# Patient Record
Sex: Male | Born: 1955
Health system: Southern US, Community
[De-identification: ages and names within clinical notes are randomized; demographics above are authoritative.]

## PROBLEM LIST (undated history)

## (undated) DIAGNOSIS — K219 Gastro-esophageal reflux disease without esophagitis: Secondary | ICD-10-CM

## (undated) DIAGNOSIS — N200 Calculus of kidney: Secondary | ICD-10-CM

## (undated) HISTORY — DX: Calculus of kidney: N20.0

## (undated) HISTORY — PX: BLADDER SURGERY: SHX569

## (undated) HISTORY — PX: TONSILLECTOMY: SUR1361

---

## 2011-07-29 ENCOUNTER — Ambulatory Visit: Payer: Self-pay | Admitting: Family

## 2018-09-16 ENCOUNTER — Ambulatory Visit (INDEPENDENT_AMBULATORY_CARE_PROVIDER_SITE_OTHER): Payer: 59 | Admitting: Nurse Practitioner

## 2018-09-16 ENCOUNTER — Other Ambulatory Visit: Payer: Self-pay

## 2018-09-16 ENCOUNTER — Encounter: Payer: Self-pay | Admitting: Nurse Practitioner

## 2018-09-16 DIAGNOSIS — G629 Polyneuropathy, unspecified: Secondary | ICD-10-CM | POA: Diagnosis not present

## 2018-09-16 DIAGNOSIS — Z87442 Personal history of urinary calculi: Secondary | ICD-10-CM | POA: Diagnosis not present

## 2018-09-16 NOTE — Patient Instructions (Signed)

## 2018-09-16 NOTE — Progress Notes (Addendum)
New Patient Office Visit  Subjective:  Patient ID: [name], male    DOB: 1955/05/12  Age: 63 y.o. MRN: [id number]  CC:  Chief Complaint  Patient presents with  . Establish Care    . This visit was completed via Doximity due to the restrictions of the COVID-19 pandemic. All issues as above were discussed and addressed. Physical exam was done as above through visual confirmation on Doximity video. If it was felt that the patient should be evaluated in the office, they were directed there. The patient verbally consented to this visit. . Location of the patient: work . Location of the provider: home . Those involved with this call:  . Provider: Marnee Guarneri, DNP . CMA: Yvonna Alanis, CMA . Front Desk/Registration: Jill Side  . Time spent on call: 25 minutes with patient face to face via video conference. More than 50% of this time was spent in counseling and coordination of care. 10 minutes total spent in review of patient's record and preparation of their chart. I verified patient identity using two factors (patient name and date of birth). Patient consents verbally to being seen via telemedicine visit today.   HPI Jordan Gaines presents for new patient visit to establish care.  Introduced to Designer, jewellery role and practice setting.  All questions answered.  Has need seen a provider in 4-5 years.  He denies any recent health issues.          Has history of kidney stones and frequent UTIs.  States urine lately has been a "little cloudy" at times.  About 2 weeks ago he saw some "dark red flakes" in urine.  His urine fluctuates between normal and darker.  Does endorses occasional episodes of discomfort in lower back, none today.         Reports episodes of hand numbness and feet numbness at times.  Has a father with diabetes, who will be 52 in January.  He only had to take oral medications.  Does endorse drinking 2 12 oz sodas a day and occasionally eats sweets, but not  "as much as I used to".  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  In his current position he "wears a lot of hats", maintenance and lifting often.  Also works part-time at [employer], [employer] in [place].         We discussed all maintenance he is due for: colonoscopy, Tetanus, HIV, Hep C screening, and prostate screening.    Past Medical History:  Diagnosis Date  . Kidney stones     Past Surgical History:  Procedure Laterality Date  . BLADDER SURGERY    . TONSILLECTOMY      Family History  Problem Relation Age of Onset  . Diabetes Mother   . Heart disease Mother   . Diabetes Father   . Multiple sclerosis Son   . Heart disease Maternal Grandfather     Social History   Socioeconomic History  . Marital status: Divorced    Spouse name: Not on file  . Number of children: Not on file  . Years of education: Not on file  . Highest education level: Not on file  Occupational History  . Occupation: Armed forces logistics/support/administrative officer    Comment: [employer]  Social Needs  . Financial resource strain: Not hard at all  . Food insecurity:    Worry: Never true    Inability: Never true  . Transportation needs:    Medical: No  Non-medical: No  Tobacco Use  . Smoking status: Never Smoker  . Smokeless tobacco: Never Used  Substance and Sexual Activity  . Alcohol use: Never    Frequency: Never  . Drug use: Never  . Sexual activity: Yes  Lifestyle  . Physical activity:    Days per week: 3 days    Minutes per session: 20 min  . Stress: Not at all  Relationships  . Social connections:    Talks on phone: More than three times a week    Gets together: More than three times a week    Attends religious service: 1 to 4 times per year    Active member of club or organization: No    Attends meetings of clubs or organizations: Never    Relationship status: Divorced  . Intimate partner violence:    Fear of current or ex partner: No    Emotionally abused: No    Physically abused: No     Forced sexual activity: No  Other Topics Concern  . Not on file  Social History Narrative  . Not on file    ROS Review of Systems  Constitutional: Negative for activity change, diaphoresis, fatigue and fever.  Respiratory: Negative for cough, chest tightness, shortness of breath and wheezing.   Cardiovascular: Negative for chest pain, palpitations and leg swelling.  Gastrointestinal: Negative for abdominal distention, abdominal pain, constipation, diarrhea, nausea and vomiting.  Endocrine: Negative for cold intolerance, heat intolerance, polydipsia, polyphagia and polyuria.  Genitourinary: Negative for decreased urine volume, discharge, dysuria, flank pain, hematuria, testicular pain and urgency.  Musculoskeletal: Negative.   Skin: Negative.   Neurological: Positive for numbness (occasional in hands and feet). Negative for dizziness, syncope, weakness, light-headedness and headaches.  Psychiatric/Behavioral: Negative.     Objective:   Today's Vitals: There were no vitals taken for this visit.  Physical Exam Vitals signs and nursing note reviewed.  Constitutional:      General: He is awake. He is not in acute distress.    Appearance: He is well-developed. He is not ill-appearing.  HENT:     Head: Normocephalic.     Right Ear: Hearing normal. No drainage.     Left Ear: Hearing normal. No drainage.  Eyes:     General: Lids are normal.        Right eye: No discharge.        Left eye: No discharge.     Conjunctiva/sclera: Conjunctivae normal.  Neck:     Musculoskeletal: Normal range of motion.  Cardiovascular:     Comments: Unable to auscultate due to virtual exam only Pulmonary:     Effort: Pulmonary effort is normal. No accessory muscle usage or respiratory distress.     Comments: Unable to auscultate due to virtual exam only Musculoskeletal: Normal range of motion.  Neurological:     Mental Status: He is alert and oriented to person, place, and time.  Psychiatric:         Mood and Affect: Mood normal.        Behavior: Behavior normal. Behavior is cooperative.        Thought Content: Thought content normal.        Judgment: Judgment normal.     Assessment & Plan:   Problem List Items Addressed This Visit      Nervous and Auditory   Neuropathy - Primary    Check A1C and physical labs on 09/19/2018 at physical.        Other  History of kidney stones    Check urine on physical exam Monday.         No outpatient encounter medications on file as of 09/16/2018.   No facility-administered encounter medications on file as of 09/16/2018.     Follow-up: Return for coming for physical 09/19/2018.   Venita Lick, NP   I discussed the assessment and treatment plan with the patient. The patient was provided an opportunity to ask questions and all were answered. The patient agreed with the plan and demonstrated an understanding of the instructions.   The patient was advised to call back or seek an in-person evaluation if the symptoms worsen or if the condition fails to improve as anticipated.   I provided 21+ minutes of time during this encounter.

## 2018-09-16 NOTE — Assessment & Plan Note (Signed)
Check urine on physical exam Monday.

## 2018-09-16 NOTE — Assessment & Plan Note (Signed)
Check A1C and physical labs on 09/19/2018 at physical.

## 2018-09-19 ENCOUNTER — Ambulatory Visit (INDEPENDENT_AMBULATORY_CARE_PROVIDER_SITE_OTHER): Payer: 59 | Admitting: Nurse Practitioner

## 2018-09-19 ENCOUNTER — Other Ambulatory Visit: Payer: Self-pay

## 2018-09-19 ENCOUNTER — Encounter: Payer: Self-pay | Admitting: Nurse Practitioner

## 2018-09-19 VITALS — BP 135/77 | HR 81 | Temp 98.2°F | Ht 74.0 in | Wt 295.0 lb

## 2018-09-19 DIAGNOSIS — E669 Obesity, unspecified: Secondary | ICD-10-CM | POA: Insufficient documentation

## 2018-09-19 DIAGNOSIS — G629 Polyneuropathy, unspecified: Secondary | ICD-10-CM

## 2018-09-19 DIAGNOSIS — Z0000 Encounter for general adult medical examination without abnormal findings: Secondary | ICD-10-CM | POA: Diagnosis not present

## 2018-09-19 DIAGNOSIS — R300 Dysuria: Secondary | ICD-10-CM | POA: Diagnosis not present

## 2018-09-19 DIAGNOSIS — Z23 Encounter for immunization: Secondary | ICD-10-CM

## 2018-09-19 DIAGNOSIS — K219 Gastro-esophageal reflux disease without esophagitis: Secondary | ICD-10-CM | POA: Diagnosis not present

## 2018-09-19 DIAGNOSIS — Z125 Encounter for screening for malignant neoplasm of prostate: Secondary | ICD-10-CM

## 2018-09-19 DIAGNOSIS — Z114 Encounter for screening for human immunodeficiency virus [HIV]: Secondary | ICD-10-CM

## 2018-09-19 DIAGNOSIS — E6609 Other obesity due to excess calories: Secondary | ICD-10-CM

## 2018-09-19 DIAGNOSIS — Z1159 Encounter for screening for other viral diseases: Secondary | ICD-10-CM

## 2018-09-19 DIAGNOSIS — Z6837 Body mass index (BMI) 37.0-37.9, adult: Secondary | ICD-10-CM

## 2018-09-19 DIAGNOSIS — Z13220 Encounter for screening for lipoid disorders: Secondary | ICD-10-CM

## 2018-09-19 DIAGNOSIS — Z1329 Encounter for screening for other suspected endocrine disorder: Secondary | ICD-10-CM

## 2018-09-19 MED ORDER — OMEPRAZOLE 20 MG PO CPDR: 20.0000 mg | DELAYED_RELEASE_CAPSULE | Freq: Every day | ORAL | 5 refills | Status: DC

## 2018-09-19 NOTE — Progress Notes (Signed)
BP 135/77    Pulse 81    Temp 98.2 F (36.8 C) (Oral)    Ht 6\' 2"  (1.88 m)    Wt 295 lb (133.8 kg)    SpO2 94%    BMI 37.88 kg/m    Subjective:    Patient ID: Jordan Gaines, male    DOB: 09/12/1955, 63 y.o.   MRN: [id number]  HPI: Jordan Gaines is a 63 y.o. male presenting on 09/19/2018 for comprehensive medical examination. Current medical complaints include: urinary symptoms intermittent + GERD  He currently lives with: self Interim Problems from his last visit: no   URINARY SYMPTOMS Has history of kidney stones and frequent UTIs.  Reports urine lately has been a "little cloudy" at times. About 2 weeks ago he saw some "dark red flakes" in urine.  His urine fluctuates between normal and darker at baseline.  Does endorse occasional episodes of discomfort in lower back, none reported today.   Dysuria: every now and then Urinary frequency: yes Urgency: yes Small volume voids: yes Symptom severity: no Urinary incontinence: no Foul odor: yes Hematuria: yes Abdominal pain: no Back pain: yes Suprapubic pain/pressure: no Flank pain: no Fever:  no Vomiting: no Relief with cranberry juice: no Relief with pyridium: no Status: stable Previous urinary tract infection: no Recurrent urinary tract infection: yes Sexual activity: monogamous History of sexually transmitted disease: no Penile discharge: no Treatments attempted: increasing fluids   GERD Currently takes Omeprazole twice a week, which helps when he gets reflux.  Has acid reflux a couple times a month.  Has had issues with reflux off/on for two years.  The last couple months has had a feeling like something is stuck in throat, intermittent, a couple times a week.  Denies difficulty swallowing with episodes, but reports frequent clearing of throat when feeling present.   GERD control status: stable  Satisfied with current treatment? yes Heartburn frequency: two times a week Medication side effects: no  Medication compliance:  stable Previous GERD medications: Antacid use frequency:  twice a week Duration: 30 minutes Nature: burning Location: epigastric Heartburn duration: 2 years Alleviatiating factors:  Tums and Prilosec Aggravating factors: foods Dysphagia: no Odynophagia:  no Hematemesis: no Blood in stool: no EGD: no   NEUROPATHY PAIN: Discussed at previous visit his ongoing, intermittent issues over past year with neuropathy pain to hands and feet.  Hands worse than feet.  Mild discomfort.  Nothing makes better or worst that he can think of.  Does not take medication at home for this.    Functional Status Survey: Is the patient deaf or have difficulty hearing?: No Does the patient have difficulty seeing, even when wearing glasses/contacts?: No Does the patient have difficulty concentrating, remembering, or making decisions?: No Does the patient have difficulty walking or climbing stairs?: No Does the patient have difficulty dressing or bathing?: No Does the patient have difficulty doing errands alone such as visiting a doctor's office or shopping?: No  FALL RISK: Fall Risk  09/16/2018  Falls in the past year? 0  Number falls in past yr: 0  Injury with Fall? 0  Follow up Falls evaluation completed    Depression Screen Depression screen [facility] 2/9 09/19/2018 09/16/2018  Decreased Interest 0 0  Down, Depressed, Hopeless 0 0  PHQ - 2 Score 0 0  Altered sleeping 1 -  Tired, decreased energy 0 -  Change in appetite 0 -  Feeling bad or failure about yourself  0 -  Trouble concentrating  0 -  Moving slowly or fidgety/restless 0 -  Suicidal thoughts 0 -  PHQ-9 Score 1 -  Difficult doing work/chores Not difficult at all -    Past Medical History:  Past Medical History:  Diagnosis Date   Kidney stones     Surgical History:  Past Surgical History:  Procedure Laterality Date   BLADDER SURGERY     TONSILLECTOMY      Medications:  Current Outpatient Medications on File Prior to Visit    Medication Sig   Omeprazole (PRILOSEC PO) Take by mouth as needed.   No current facility-administered medications on file prior to visit.     Allergies:  No Known Allergies  Social History:  Social History   Socioeconomic History   Marital status: Divorced    Spouse name: Not on file   Number of children: Not on file   Years of education: Not on file   Highest education level: Not on file  Occupational History   Occupation: Armed forces logistics/support/administrative officer    Comment: [employer] resource strain: Not hard at all   Food insecurity:    Worry: Never true    Inability: Never true   Transportation needs:    Medical: No    Non-medical: No  Tobacco Use   Smoking status: Never Smoker   Smokeless tobacco: Never Used  Substance and Sexual Activity   Alcohol use: Never    Frequency: Never   Drug use: Never   Sexual activity: Yes  Lifestyle   Physical activity:    Days per week: 3 days    Minutes per session: 20 min   Stress: Not at all  Relationships   Social connections:    Talks on phone: More than three times a week    Gets together: More than three times a week    Attends religious service: 1 to 4 times per year    Active member of club or organization: No    Attends meetings of clubs or organizations: Never    Relationship status: Divorced   Intimate partner violence:    Fear of current or ex partner: No    Emotionally abused: No    Physically abused: No    Forced sexual activity: No  Other Topics Concern   Not on file  Social History Narrative   Not on file   Social History   Tobacco Use  Smoking Status Never Smoker  Smokeless Tobacco Never Used   Social History   Substance and Sexual Activity  Alcohol Use Never   Frequency: Never    Family History:  Family History  Problem Relation Age of Onset   Diabetes Mother    Heart disease Mother    Diabetes Father    Multiple sclerosis Son    Heart disease Maternal  Grandfather     Past medical history, surgical history, medications, allergies, family history and social history reviewed with patient today and changes made to appropriate areas of the chart.   Review of Systems - heart burn and urinary symptoms All other ROS negative except what is listed above and in the HPI.      Objective:    BP 135/77    Pulse 81    Temp 98.2 F (36.8 C) (Oral)    Ht 6\' 2"  (1.88 m)    Wt 295 lb (133.8 kg)    SpO2 94%    BMI 37.88 kg/m   Wt Readings from Last 3 Encounters:  09/19/18 295 lb (133.8 kg)    Physical Exam Vitals signs and nursing note reviewed.  Constitutional:      General: He is not in acute distress.    Appearance: He is well-developed. He is obese. He is not ill-appearing.  HENT:     Head: Normocephalic and atraumatic.     Right Ear: Hearing, tympanic membrane, ear canal and external ear normal. No drainage.     Left Ear: Hearing, tympanic membrane, ear canal and external ear normal. No drainage.     Nose: Nose normal.     Mouth/Throat:     Mouth: Mucous membranes are moist.     Pharynx: Uvula midline.  Eyes:     General: Lids are normal.        Right eye: No discharge.        Left eye: No discharge.     Extraocular Movements: Extraocular movements intact.     Conjunctiva/sclera: Conjunctivae normal.     Pupils: Pupils are equal, round, and reactive to light.     Visual Fields: Right eye visual fields normal and left eye visual fields normal.  Neck:     Musculoskeletal: Normal range of motion and neck supple.     Thyroid: No thyromegaly.     Vascular: No carotid bruit.     Trachea: Trachea normal.  Cardiovascular:     Rate and Rhythm: Normal rate and regular rhythm.     Heart sounds: Normal heart sounds, S1 normal and S2 normal. No murmur. No gallop.   Pulmonary:     Effort: Pulmonary effort is normal. No accessory muscle usage or respiratory distress.     Breath sounds: Normal breath sounds.  Abdominal:     General: Bowel  sounds are normal.     Palpations: Abdomen is soft. There is no hepatomegaly or splenomegaly.     Tenderness: There is no abdominal tenderness. There is no right CVA tenderness or left CVA tenderness.     Hernia: No hernia is present.  Musculoskeletal: Normal range of motion.     Right lower leg: No edema.     Left lower leg: No edema.     Comments: Normal ROM and strength BLE and BUE.  Positive Tinel and Phalen on exam BUE.  Skin:    General: Skin is warm and dry.     Capillary Refill: Capillary refill takes less than 2 seconds.     Findings: No rash.  Neurological:     Mental Status: He is alert and oriented to person, place, and time.     Deep Tendon Reflexes: Reflexes are normal and symmetric.  Psychiatric:        Attention and Perception: Attention normal.        Mood and Affect: Mood normal.        Speech: Speech normal.        Behavior: Behavior normal. Behavior is cooperative.        Thought Content: Thought content normal.        Cognition and Memory: Cognition normal.        Judgment: Judgment normal.      No results found for this or any previous visit.    Assessment & Plan:   Problem List Items Addressed This Visit      Digestive   Gastroesophageal reflux disease without esophagitis    Ongoing, recommend daily Prilosec at this time.  Script sent. Has not had colonoscopy, will refer to GI and may possibly benefit from  upper GI series at same time due to ongoing GERD issues with recent feeling of bolus in throat intermittently.  Referral to GI placed.  Follow-up one month.  CMP & CBC today      Relevant Medications   Omeprazole (PRILOSEC PO)   omeprazole (PRILOSEC) 20 MG capsule   Other Relevant Orders   Ambulatory referral to Gastroenterology     Nervous and Auditory   Neuropathy    Check A1C, B12, and TSH on labs today.  Dependent on labs treat accordingly.  Consider neurology referral for possible Carpal Tunnel if ongoing issues and labs return WNL.          Relevant Orders   Vitamin B12     Other   Dysuria    Will obtain urine sample today, if positive findings will treat accordingly.  If ongoing issues with hematuria and kidney stones consider urology referral.  Obtain PSA lab today for screening.        Relevant Orders   UA/M w/rflx Culture, Routine   Obesity    Recommend continued focus on health diet choices and regular physical activity (30 minutes 5 days a week).       Other Visit Diagnoses    Encounter for annual physical exam    -  Primary   Relevant Orders   CBC with Differential/Platelet   Comprehensive metabolic panel   Lipid Panel w/o Chol/HDL Ratio   TSH   HgB A1c   PSA   Encounter for screening for HIV       HIV screen ordered   Relevant Orders   HIV Antibody (routine testing w rflx)   Need for hepatitis C screening test       Hep C screen ordered   Relevant Orders   Hepatitis C antibody   Need for diphtheria-tetanus-pertussis (Tdap) vaccine       Td ordered   Relevant Orders   Tdap vaccine greater than or equal to 7yo IM (Completed)   Prostate cancer screening       PSA ordered   Relevant Orders   PSA   Thyroid disorder screen       TSH ordered   Relevant Orders   TSH   Screening cholesterol level       Lipid panel   Relevant Orders   Lipid Panel w/o Chol/HDL Ratio       Discussed aspirin prophylaxis for myocardial infarction prevention and decision was that we recommended ASA, and patient refused  LABORATORY TESTING:  Health maintenance labs ordered today as discussed above.   The natural history of prostate cancer and ongoing controversy regarding screening and potential treatment outcomes of prostate cancer has been discussed with the patient. The meaning of a false positive PSA and a false negative PSA has been discussed. He indicates understanding of the limitations of this screening test and wishes to proceed with screening PSA testing.   IMMUNIZATIONS:   - Tdap: Tetanus vaccination  status reviewed: Td vaccination indicated and given today. - Influenza: Up to date - Pneumovax: Not applicable - Prevnar: Not applicable - Zostavax vaccine: Refused  SCREENING: - Colonoscopy: Ordered today  Discussed with patient purpose of the colonoscopy is to detect colon cancer at curable precancerous or early stages   - AAA Screening: Not applicable  -Hearing Test: Not applicable  -Spirometry: Not applicable   PATIENT COUNSELING:    Sexuality: Discussed sexually transmitted diseases, partner selection, use of condoms, avoidance of unintended pregnancy  and contraceptive alternatives.  Advised to avoid cigarette smoking.  I discussed with the patient that most people either abstain from alcohol or drink within safe limits (<=14/week and <=4 drinks/occasion for males, <=7/weeks and <= 3 drinks/occasion for females) and that the risk for alcohol disorders and other health effects rises proportionally with the number of drinks per week and how often a drinker exceeds daily limits.  Discussed cessation/primary prevention of drug use and availability of treatment for abuse.   Diet: Encouraged to adjust caloric intake to maintain  or achieve ideal body weight, to reduce intake of dietary saturated fat and total fat, to limit sodium intake by avoiding high sodium foods and not adding table salt, and to maintain adequate dietary potassium and calcium preferably from fresh fruits, vegetables, and low-fat dairy products.    stressed the importance of regular exercise  Injury prevention: Discussed safety belts, safety helmets, smoke detector, smoking near bedding or upholstery.   Dental health: Discussed importance of regular tooth brushing, flossing, and dental visits.   Follow up plan: NEXT PREVENTATIVE PHYSICAL DUE IN 1 YEAR. Return in about 4 weeks (around 10/17/2018) for Follow-up.

## 2018-09-19 NOTE — Assessment & Plan Note (Signed)
Will obtain urine sample today, if positive findings will treat accordingly.  If ongoing issues with hematuria and kidney stones consider urology referral.  Obtain PSA lab today for screening.

## 2018-09-19 NOTE — Assessment & Plan Note (Signed)
Ongoing, recommend daily Prilosec at this time.  Script sent. Has not had colonoscopy, will refer to GI and may possibly benefit from upper GI series at same time due to ongoing GERD issues with recent feeling of bolus in throat intermittently.  Referral to GI placed.  Follow-up one month.  CMP & CBC today

## 2018-09-19 NOTE — Patient Instructions (Signed)

## 2018-09-19 NOTE — Assessment & Plan Note (Signed)
Check A1C, B12, and TSH on labs today.  Dependent on labs treat accordingly.  Consider neurology referral for possible Carpal Tunnel if ongoing issues and labs return WNL.

## 2018-09-19 NOTE — Assessment & Plan Note (Signed)
Recommend continued focus on health diet choices and regular physical activity (30 minutes 5 days a week). 

## 2018-09-20 ENCOUNTER — Telehealth: Payer: Self-pay | Admitting: Nurse Practitioner

## 2018-09-20 ENCOUNTER — Other Ambulatory Visit: Payer: Self-pay | Admitting: Nurse Practitioner

## 2018-09-20 LAB — COMPREHENSIVE METABOLIC PANEL
ALT: 13 IU/L (ref 0–44)
AST: 15 IU/L (ref 0–40)
Albumin/Globulin Ratio: 1.5 (ref 1.2–2.2)
Albumin: 4.1 g/dL (ref 3.8–4.8)
Alkaline Phosphatase: 50 IU/L (ref 39–117)
BUN/Creatinine Ratio: 22 (ref 10–24)
BUN: 21 mg/dL (ref 8–27)
Bilirubin Total: 0.5 mg/dL (ref 0.0–1.2)
CO2: 23 mmol/L (ref 20–29)
Calcium: 9.3 mg/dL (ref 8.6–10.2)
Chloride: 107 mmol/L — ABNORMAL HIGH (ref 96–106)
Creatinine, Ser: 0.97 mg/dL (ref 0.76–1.27)
GFR calc Af Amer: 95 mL/min/{1.73_m2} (ref >59)
GFR calc non Af Amer: 82 mL/min/{1.73_m2} (ref >59)
Globulin, Total: 2.7 g/dL (ref 1.5–4.5)
Glucose: 92 mg/dL (ref 65–99)
Potassium: 4 mmol/L (ref 3.5–5.2)
Sodium: 143 mmol/L (ref 134–144)
Total Protein: 6.8 g/dL (ref 6.0–8.5)

## 2018-09-20 LAB — LIPID PANEL W/O CHOL/HDL RATIO
Cholesterol, Total: 169 mg/dL (ref 100–199)
HDL: 36 mg/dL — ABNORMAL LOW (ref >39)
LDL Calculated: 113 mg/dL — ABNORMAL HIGH (ref 0–99)
Triglycerides: 100 mg/dL (ref 0–149)
VLDL Cholesterol Cal: 20 mg/dL (ref 5–40)

## 2018-09-20 LAB — CBC WITH DIFFERENTIAL/PLATELET
Basophils Absolute: 0.1 10*3/uL (ref 0.0–0.2)
Basos: 1 %
EOS (ABSOLUTE): 0.1 10*3/uL (ref 0.0–0.4)
Eos: 1 %
Hematocrit: 43.2 % (ref 37.5–51.0)
Hemoglobin: 14.3 g/dL (ref 13.0–17.7)
Immature Grans (Abs): 0 10*3/uL (ref 0.0–0.1)
Immature Granulocytes: 0 %
Lymphocytes Absolute: 1.6 10*3/uL (ref 0.7–3.1)
Lymphs: 22 %
MCH: 29.9 pg (ref 26.6–33.0)
MCHC: 33.1 g/dL (ref 31.5–35.7)
MCV: 90 fL (ref 79–97)
Monocytes Absolute: 0.6 10*3/uL (ref 0.1–0.9)
Monocytes: 8 %
Neutrophils Absolute: 5 10*3/uL (ref 1.4–7.0)
Neutrophils: 68 %
Platelets: 216 10*3/uL (ref 150–450)
RBC: 4.79 x10E6/uL (ref 4.14–5.80)
RDW: 12.2 % (ref 11.6–15.4)
WBC: 7.4 10*3/uL (ref 3.4–10.8)

## 2018-09-20 LAB — HIV ANTIBODY (ROUTINE TESTING W REFLEX): HIV Screen 4th Generation wRfx: NONREACTIVE

## 2018-09-20 LAB — HEMOGLOBIN A1C
Est. average glucose Bld gHb Est-mCnc: 105 mg/dL
Hgb A1c MFr Bld: 5.3 % (ref 4.8–5.6)

## 2018-09-20 LAB — VITAMIN B12: Vitamin B-12: 400 pg/mL (ref 232–1245)

## 2018-09-20 LAB — HEPATITIS C ANTIBODY: Hep C Virus Ab: <0.1 s/co ratio (ref 0.0–0.9)

## 2018-09-20 LAB — TSH: TSH: 1.63 u[IU]/mL (ref 0.450–4.500)

## 2018-09-20 LAB — PSA: Prostate Specific Ag, Serum: 0.7 ng/mL (ref 0.0–4.0)

## 2018-09-20 MED ORDER — ATORVASTATIN CALCIUM 10 MG PO TABS: 10.0000 mg | ORAL_TABLET | Freq: Every day | ORAL | 5 refills | Status: DC

## 2018-09-20 MED ORDER — CIPROFLOXACIN HCL 500 MG PO TABS: 500.0000 mg | ORAL_TABLET | Freq: Two times a day (BID) | ORAL | 0 refills | Status: AC

## 2018-09-20 NOTE — Telephone Encounter (Signed)
Spoke to patient via telephone and reviewed

## 2018-09-20 NOTE — Telephone Encounter (Signed)
Discussed lab results with patient via telephone, sending in abx for UTI and medication for cholesterol level with ASCVD of 13.9%.  He agrees and reports understanding of POC.  To return once abx done for urine sample.

## 2018-09-20 NOTE — Telephone Encounter (Signed)
Copied from [place] [id number]. Topic: General - Other >> Sep 20, 2018 10:52 AM Rutherford Nail, NT wrote: Reason for CRM: Patient returning a call to [facility] regarding lab results. Attempted office x2, no answer. Please advise. States that if a voicemail with results could be left, that would be great since he is not allowed to have his phone inside work. >> Sep 20, 2018 12:16 PM Leward Quan A wrote: Patient called back for lab results and stated that it was the third time he called for results from yesterday. Asking for a call back today will be on lunce until 1.15 pm

## 2018-09-21 LAB — UA/M W/RFLX CULTURE, ROUTINE
Bilirubin, UA: NEGATIVE
Glucose, UA: NEGATIVE
Ketones, UA: NEGATIVE
Nitrite, UA: POSITIVE — AB
Specific Gravity, UA: >1.03 — ABNORMAL HIGH (ref 1.005–1.030)
Urobilinogen, Ur: 0.2 mg/dL (ref 0.2–1.0)
pH, UA: 6 (ref 5.0–7.5)

## 2018-09-21 LAB — MICROSCOPIC EXAMINATION
RBC, Urine: >30 /hpf — AB (ref 0–2)
WBC, UA: >30 /hpf — AB (ref 0–5)

## 2018-09-21 LAB — URINE CULTURE, REFLEX

## 2018-09-28 ENCOUNTER — Telehealth: Payer: Self-pay | Admitting: Nurse Practitioner

## 2018-09-28 DIAGNOSIS — R300 Dysuria: Secondary | ICD-10-CM

## 2018-09-28 NOTE — Telephone Encounter (Signed)
Yes, will place order for urine for Friday.

## 2018-09-28 NOTE — Telephone Encounter (Signed)
Copied from [place] [id number]. Topic: Appointment Scheduling - Scheduling Inquiry for Clinic >> Sep 26, 2018  4:07 PM Margot Ables wrote: Reason for CRM: pt states he is wanting to come in Thursday for a repeat urinalysis. He said Lucus Lambertson advised him to come by. Please advise. >> Sep 28, 2018  9:26 AM Don Perking M wrote: I did not see an order, is this ok or would the pt need an appt?

## 2018-09-29 ENCOUNTER — Other Ambulatory Visit: Payer: 59

## 2018-09-29 ENCOUNTER — Other Ambulatory Visit: Payer: Self-pay

## 2018-09-29 ENCOUNTER — Other Ambulatory Visit: Payer: Self-pay | Admitting: Nurse Practitioner

## 2018-09-29 DIAGNOSIS — R3121 Asymptomatic microscopic hematuria: Secondary | ICD-10-CM

## 2018-09-29 DIAGNOSIS — R300 Dysuria: Secondary | ICD-10-CM

## 2018-09-29 LAB — UA/M W/RFLX CULTURE, ROUTINE
Bilirubin, UA: NEGATIVE
Glucose, UA: NEGATIVE
Ketones, UA: NEGATIVE
Leukocytes,UA: NEGATIVE
Nitrite, UA: NEGATIVE
Specific Gravity, UA: 1.02 (ref 1.005–1.030)
Urobilinogen, Ur: 0.2 mg/dL (ref 0.2–1.0)
pH, UA: 7 (ref 5.0–7.5)

## 2018-09-29 LAB — MICROSCOPIC EXAMINATION
Bacteria, UA: NONE SEEN
RBC, Urine: >30 /hpf — AB (ref 0–2)

## 2018-09-29 NOTE — Progress Notes (Signed)
Urology referral for ongoing hematuria and h/o similar + kidney stones.

## 2018-09-29 NOTE — Telephone Encounter (Addendum)
Called and left pt message that orders were in to call to schedule lab appointment.

## 2018-10-17 ENCOUNTER — Other Ambulatory Visit: Payer: Self-pay

## 2018-10-17 ENCOUNTER — Encounter: Payer: Self-pay | Admitting: Nurse Practitioner

## 2018-10-17 ENCOUNTER — Ambulatory Visit (INDEPENDENT_AMBULATORY_CARE_PROVIDER_SITE_OTHER): Payer: 59 | Admitting: Nurse Practitioner

## 2018-10-17 VITALS — BP 136/80 | HR 70 | Temp 98.4°F

## 2018-10-17 DIAGNOSIS — B351 Tinea unguium: Secondary | ICD-10-CM | POA: Diagnosis not present

## 2018-10-17 DIAGNOSIS — Z23 Encounter for immunization: Secondary | ICD-10-CM | POA: Diagnosis not present

## 2018-10-17 DIAGNOSIS — R3129 Other microscopic hematuria: Secondary | ICD-10-CM | POA: Insufficient documentation

## 2018-10-17 DIAGNOSIS — R3121 Asymptomatic microscopic hematuria: Secondary | ICD-10-CM

## 2018-10-17 DIAGNOSIS — E7800 Pure hypercholesterolemia, unspecified: Secondary | ICD-10-CM

## 2018-10-17 DIAGNOSIS — B354 Tinea corporis: Secondary | ICD-10-CM | POA: Diagnosis not present

## 2018-10-17 DIAGNOSIS — Z87448 Personal history of other diseases of urinary system: Secondary | ICD-10-CM | POA: Insufficient documentation

## 2018-10-17 MED ORDER — NYSTATIN 100000 UNIT/GM EX CREA: 1.0000 "application " | TOPICAL_CREAM | Freq: Two times a day (BID) | CUTANEOUS | 1 refills | Status: DC

## 2018-10-17 NOTE — Assessment & Plan Note (Signed)
Bilateral feet. Recommend OTC medications to start, Lamisil or Lotrimin.  Educated on fungal infections of toenails and high potential for recurrence even if improvement with OTC meds.

## 2018-10-17 NOTE — Assessment & Plan Note (Signed)
Continues to pass 3+ blood and 1+ protein on urine & today leuk 1+ + many bacteria.  Will wait on culture prior to determining treatment need as patient currently asymptomatic.  He is scheduled to see urology in August for ongoing hematuria.  Continue to monitor.

## 2018-10-17 NOTE — Progress Notes (Signed)
BP 136/80 (BP Location: Left Arm, Patient Position: Sitting)   Pulse 70   Temp 98.4 F (36.9 C) (Oral)   SpO2 95%    Subjective:    Patient ID: Jordan Gaines, male    DOB: 12/13/1955, 63 y.o.   MRN: [id number]  HPI: Jordan Gaines is a 63 y.o. male  Chief Complaint  Patient presents with  . Follow-up   HYPERLIPIDEMIA Started on Atorvastatin at 10 MG on 09/20/18.  09/19/18 LDL 113 and TCHOL 169. Hyperlipidemia status: good compliance Satisfied with current treatment?  yes Side effects:  no Medication compliance: good compliance Past cholesterol meds: atorvastain (lipitor) Supplements: none Aspirin:  no The 10-year ASCVD risk score Mikey Bussing DC Jr., et al., 2013) is: 15.1%   Values used to calculate the score:     Age: 79 years     Sex: Male     Is Non-Hispanic African American: No     Diabetic: No     Tobacco smoker: No     Systolic Blood Pressure: 660 mmHg     Is BP treated: No     HDL Cholesterol: 36 mg/dL     Total Cholesterol: 169 mg/dL Chest pain:  no Coronary artery disease:  no Family history CAD:  no Family history early CAD:  no   HEMATURIA: Treated for UTI in the beginning of June with Cipro.  Has history of kidney stones and reported he frequently has episodes of blood in urine.  On recheck of urine after UTI treated continued to have blood present, 3+ and >30 micro, although he reported not noting it on urination and improvement in UTI symptoms.  Due to his past history with kidney stones in 2007 and his self report of frequent episodes blood in urine, he is scheduled to see urology in August.  Denies dysuria, hematuria, frequency, urgency.  RASH Has been present for a few months, fluctuates between being prominent and then not as prominent. Duration:  months  Location: trunk  Itching: yes Burning: no Redness: no Oozing: no Scaling: no Blisters: no Painful: no Fevers: no Change in detergents/soaps/personal care products: no Recent illness: no Recent  travel:no History of same: no Context: stable Alleviating factors: antibiotic cream Treatments attempted:antibiotic cream Shortness of breath: no  Throat/tongue swelling: no Myalgias/arthralgias: no   ONYCHOMYCOSIS: Reports he has had fungal issues with toes for several years, every other toenail.  States tip of toes recently started getting a little discolored, had shoes that were too small and now has purchased shoes that fit better and this has improved.  Has not tried any OTC medications for toes.  Reports toenails are thick and difficult to cut, his girlfriend cuts them for him.  States he does wear boots a lot at work and has moisture present to feet.  Relevant past medical, surgical, family and social history reviewed and updated as indicated. Interim medical history since our last visit reviewed. Allergies and medications reviewed and updated.  Review of Systems  Constitutional: Negative for activity change, diaphoresis, fatigue and fever.  Respiratory: Negative for cough, chest tightness, shortness of breath and wheezing.   Cardiovascular: Negative for chest pain, palpitations and leg swelling.  Gastrointestinal: Negative for abdominal distention, abdominal pain, constipation, diarrhea, nausea and vomiting.  Endocrine: Negative for cold intolerance, heat intolerance, polydipsia, polyphagia and polyuria.  Genitourinary: Negative for decreased urine volume, dysuria, flank pain, frequency, hematuria, penile pain and urgency.  Musculoskeletal: Negative.   Skin: Positive for rash.  Neurological: Negative for  dizziness, syncope, weakness, light-headedness, numbness and headaches.  Psychiatric/Behavioral: Negative.     Per HPI unless specifically indicated above     Objective:    BP 136/80 (BP Location: Left Arm, Patient Position: Sitting)   Pulse 70   Temp 98.4 F (36.9 C) (Oral)   SpO2 95%   Wt Readings from Last 3 Encounters:  09/19/18 295 lb (133.8 kg)    Physical Exam  Vitals signs and nursing note reviewed.  Constitutional:      General: He is awake. He is not in acute distress.    Appearance: He is well-developed. He is not ill-appearing.  HENT:     Head: Normocephalic and atraumatic.     Right Ear: Hearing normal. No drainage.     Left Ear: Hearing normal. No drainage.     Mouth/Throat:     Pharynx: Uvula midline.  Eyes:     General: Lids are normal.        Right eye: No discharge.        Left eye: No discharge.     Conjunctiva/sclera: Conjunctivae normal.     Pupils: Pupils are equal, round, and reactive to light.  Neck:     Musculoskeletal: Normal range of motion and neck supple.     Thyroid: No thyromegaly.     Vascular: No carotid bruit or JVD.     Trachea: Trachea normal.  Cardiovascular:     Rate and Rhythm: Normal rate and regular rhythm.     Pulses:          Dorsalis pedis pulses are 2+ on the right side and 2+ on the left side.       Posterior tibial pulses are 2+ on the right side and 2+ on the left side.     Heart sounds: Normal heart sounds, S1 normal and S2 normal. No murmur. No gallop.   Pulmonary:     Effort: Pulmonary effort is normal.     Breath sounds: Normal breath sounds.  Abdominal:     General: Bowel sounds are normal.     Palpations: Abdomen is soft. There is no hepatomegaly or splenomegaly.  Musculoskeletal: Normal range of motion.     Right lower leg: No edema.     Left lower leg: No edema.  Feet:     Right foot:     Skin integrity: Skin integrity normal.     Toenail Condition: Right toenails are abnormally thick. Fungal disease present.    Left foot:     Skin integrity: Skin integrity normal.     Toenail Condition: Left toenails are abnormally thick. Fungal disease present. Skin:    General: Skin is warm and dry.     Capillary Refill: Capillary refill takes less than 2 seconds.     Findings: No rash.       Neurological:     Mental Status: He is alert and oriented to person, place, and time.     Deep  Tendon Reflexes: Reflexes are normal and symmetric.  Psychiatric:        Mood and Affect: Mood normal.        Behavior: Behavior normal. Behavior is cooperative.        Thought Content: Thought content normal.        Judgment: Judgment normal.     Results for orders placed or performed in visit on 09/29/18  Microscopic Examination   URINE  Result Value Ref Range   WBC, UA 0-5 0 - 5 /hpf  RBC >30 (A) 0 - 2 /hpf   Epithelial Cells (non renal) 0-10 0 - 10 /hpf   Bacteria, UA None seen None seen/Few  UA/M w/rflx Culture, Routine   Specimen: Urine   URINE  Result Value Ref Range   Specific Gravity, UA 1.020 1.005 - 1.030   pH, UA 7.0 5.0 - 7.5   Color, UA Yellow Yellow   Appearance Ur Clear Clear   Leukocytes,UA Negative Negative   Protein,UA 2+ (A) Negative/Trace   Glucose, UA Negative Negative   Ketones, UA Negative Negative   RBC, UA 3+ (A) Negative   Bilirubin, UA Negative Negative   Urobilinogen, Ur 0.2 0.2 - 1.0 mg/dL   Nitrite, UA Negative Negative   Microscopic Examination See below:       Assessment & Plan:   Problem List Items Addressed This Visit      Musculoskeletal and Integument   Tinea corporis    Script for nystatin cream sent.  Educated on proper hygiene techniques and drying thoroughly after showering.      Relevant Medications   nystatin cream (MYCOSTATIN)   Onychomycosis    Bilateral feet. Recommend OTC medications to start, Lamisil or Lotrimin.  Educated on fungal infections of toenails and high potential for recurrence even if improvement with OTC meds.      Relevant Medications   nystatin cream (MYCOSTATIN)     Genitourinary   Asymptomatic microscopic hematuria - Primary    Continues to pass 3+ blood and 1+ protein on urine & today leuk 1+ + many bacteria.  Will wait on culture prior to determining treatment need as patient currently asymptomatic.  He is scheduled to see urology in August for ongoing hematuria.  Continue to monitor.       Relevant Orders   UA/M w/rflx Culture, Routine     Other   Pure hypercholesterolemia    Started on Lipitor last visit.  Lipid panel today and adjust dose as needed.  Continue focus on diet at home.      Relevant Orders   Lipid Panel w/o Chol/HDL Ratio    Other Visit Diagnoses    Need for pneumococcal vaccination       Relevant Orders   Pneumococcal conjugate vaccine 13-valent IM (Completed)       Follow up plan: Return in about 3 months (around 01/17/2019) for HLD and hematuria.

## 2018-10-17 NOTE — Assessment & Plan Note (Signed)
Started on Lipitor last visit.  Lipid panel today and adjust dose as needed.  Continue focus on diet at home.

## 2018-10-17 NOTE — Assessment & Plan Note (Signed)
Script for nystatin cream sent.  Educated on proper hygiene techniques and drying thoroughly after showering.

## 2018-10-17 NOTE — Patient Instructions (Signed)
Pneumococcal Conjugate Vaccine (PCV13): What You Need to Know 1. Why get vaccinated? Pneumococcal conjugate vaccine (PCV13) can prevent pneumococcal disease. Pneumococcal disease refers to any illness caused by pneumococcal bacteria. These bacteria can cause many types of illnesses, including pneumonia, which is an infection of the lungs. Pneumococcal bacteria are one of the most common causes of pneumonia. Besides pneumonia, pneumococcal bacteria can also cause:  Ear infections  Sinus infections  Meningitis (infection of the tissue covering the brain and spinal cord)  Bacteremia (bloodstream infection) Anyone can get pneumococcal disease, but children under 2 years of age, people with certain medical conditions, adults 65 years or older, and cigarette smokers are at the highest risk. Most pneumococcal infections are mild. However, some can result in long-term problems, such as brain damage or hearing loss. Meningitis, bacteremia, and pneumonia caused by pneumococcal disease can be fatal. 2. PCV13 PCV13 protects against 13 types of bacteria that cause pneumococcal disease. Infants and young children usually need 4 doses of pneumococcal conjugate vaccine, at 2, 4, 6, and 12-15 months of age. In some cases, a child might need fewer than 4 doses to complete PCV13 vaccination. A dose of PCV23 vaccine is also recommended for anyone 2 years or older with certain medical conditions if they did not already receive PCV13. This vaccine may be given to adults 65 years or older based on discussions between the patient and health care provider. 3. Talk with your health care provider Tell your vaccine provider if the person getting the vaccine:  Has had an allergic reaction after a previous dose of PCV13, to an earlier pneumococcal conjugate vaccine known as PCV7, or to any vaccine containing diphtheria toxoid (for example, DTaP), or has any severe, life-threatening allergies.  In some cases, your health  care provider may decide to postpone PCV13 vaccination to a future visit. People with minor illnesses, such as a cold, may be vaccinated. People who are moderately or severely ill should usually wait until they recover before getting PCV13. Your health care provider can give you more information. 4. Risks of a vaccine reaction  Redness, swelling, pain, or tenderness where the shot is given, and fever, loss of appetite, fussiness (irritability), feeling tired, headache, and chills can happen after PCV13. Young children may be at increased risk for seizures caused by fever after PCV13 if it is administered at the same time as inactivated influenza vaccine. Ask your health care provider for more information. People sometimes faint after medical procedures, including vaccination. Tell your provider if you feel dizzy or have vision changes or ringing in the ears. As with any medicine, there is a very remote chance of a vaccine causing a severe allergic reaction, other serious injury, or death. 5. What if there is a serious problem? An allergic reaction could occur after the vaccinated person leaves the clinic. If you see signs of a severe allergic reaction (hives, swelling of the face and throat, difficulty breathing, a fast heartbeat, dizziness, or weakness), call 9-1-1 and get the person to the nearest hospital. For other signs that concern you, call your health care provider. Adverse reactions should be reported to the Vaccine Adverse Event Reporting System (VAERS). Your health care provider will usually file this report, or you can do it yourself. Visit the VAERS website at www.vaers.hhs.gov or call 1-800-822-7967. VAERS is only for reporting reactions, and VAERS staff do not give medical advice. 6. The National Vaccine Injury Compensation Program The National Vaccine Injury Compensation Program (VICP) is a federal program   that was created to compensate people who may have been injured by certain  vaccines. Visit the VICP website at GoldCloset.com.ee or call (458) 518-9520 to learn about the program and about filing a claim. There is a time limit to file a claim for compensation. 7. How can I learn more?  Ask your health care provider.  Call your local or state health department.  Contact the Centers for Disease Control and Prevention (CDC): ? Call 9074175420 (1-800-CDC-INFO) or ? Visit CDC's website at http://hunter.com/ Vaccine Information Statement PCV13 Vaccine (02/09/2018) This information is not intended to replace advice given to you by your health care provider. Make sure you discuss any questions you have with your health care provider. Document Released: 01/25/2006 Document Revised: 07/19/2018 Document Reviewed: 11/09/2017 Elsevier Patient Education  2020 [employer].

## 2018-10-18 ENCOUNTER — Encounter: Payer: Self-pay | Admitting: Gastroenterology

## 2018-10-18 ENCOUNTER — Ambulatory Visit (INDEPENDENT_AMBULATORY_CARE_PROVIDER_SITE_OTHER): Payer: 59 | Admitting: Gastroenterology

## 2018-10-18 ENCOUNTER — Other Ambulatory Visit: Payer: Self-pay

## 2018-10-18 VITALS — BP 132/83 | HR 62 | Temp 97.9°F | Ht 74.0 in | Wt 308.8 lb

## 2018-10-18 DIAGNOSIS — K219 Gastro-esophageal reflux disease without esophagitis: Secondary | ICD-10-CM | POA: Diagnosis not present

## 2018-10-18 DIAGNOSIS — Z1211 Encounter for screening for malignant neoplasm of colon: Secondary | ICD-10-CM

## 2018-10-18 LAB — LIPID PANEL W/O CHOL/HDL RATIO
Cholesterol, Total: 111 mg/dL (ref 100–199)
HDL: 28 mg/dL — ABNORMAL LOW (ref >39)
LDL Calculated: 64 mg/dL (ref 0–99)
Triglycerides: 94 mg/dL (ref 0–149)
VLDL Cholesterol Cal: 19 mg/dL (ref 5–40)

## 2018-10-18 MED ORDER — FAMOTIDINE 20 MG PO TABS: 20.0000 mg | ORAL_TABLET | Freq: Two times a day (BID) | ORAL | 1 refills | Status: DC

## 2018-10-18 NOTE — Progress Notes (Signed)
Jordan Bellows MD, MRCP(U.K) [address]  [place], [place] [address]  Main: [phone]  Fax: [phone]   Gastroenterology Consultation  Referring Provider:     Venita Lick, NP Primary Care Physician:  Jordan Lick, NP Primary Gastroenterologist:  Dr. Jonathon Gaines  Reason for Consultation:     GERD        HPI:   Jordan Gaines is a 63 y.o. y/o male referred for consultation & management  by Dr. Venita Lick, NP.      Reflux:  Onset : last few years on and off- never severe - likely 3-4 years.  Symptoms: heart burn - occurs when he misses his medications- once veery few weeks  Recent weight gain: no  Narcotics or anticholinergics use : no  PPI /H2 blockers or Antacid  use and timing :omeprazole 20 mgfirst thing in the morning  Dinner time : 6-7 pm - goes to bed around 10 pm - sitting up in between   Prior EGD: no  Family history of esophageal cancer:no  He is not a smoker, does not chew tobacco.    Never had a screening colonoscopy , no GI symptoms. No family history of colon cancer or polyps/   Past Medical History:  Diagnosis Date  . Kidney stones     Past Surgical History:  Procedure Laterality Date  . BLADDER SURGERY    . TONSILLECTOMY      Prior to Admission medications   Medication Sig Start Date End Date Taking? Authorizing Provider  nystatin cream (MYCOSTATIN) Apply 1 application topically 2 (two) times daily. 10/17/18  Yes Gaines, Jordan T, NP  omeprazole (PRILOSEC) 20 MG capsule Take 1 capsule (20 mg total) by mouth daily. 09/19/18  Yes Gaines, Jordan T, NP  atorvastatin (LIPITOR) 10 MG tablet Take 1 tablet (10 mg total) by mouth daily at 6 PM. Patient not taking: Reported on 10/18/2018 09/20/18   Jordan Lick, NP    Family History  Problem Relation Age of Onset  . Diabetes Mother   . Heart disease Mother   . Diabetes Father   . Multiple sclerosis Son   . Heart disease Maternal Grandfather      Social History    Tobacco Use  . Smoking status: Never Smoker  . Smokeless tobacco: Never Used  Substance Use Topics  . Alcohol use: Never    Frequency: Never  . Drug use: Never    Allergies as of 10/18/2018  . (No Known Allergies)    Review of Systems:    All systems reviewed and negative except where noted in HPI.   Physical Exam:  BP 132/83   Pulse 62   Temp 97.9 F (36.6 C)   Ht 6\' 2"  (1.88 m)   Wt (!) 308 lb 12.8 oz (140.1 kg)   BMI 39.65 kg/m  No LMP for male patient. Psych:  Alert and cooperative. Normal mood and affect. General:   Alert,  Well-developed, well-nourished, pleasant and cooperative in NAD Head:  Normocephalic and atraumatic. Eyes:  Sclera clear, no icterus.   Conjunctiva pink. Ears:  Normal auditory acuity. Nose:  No deformity, discharge, or lesions. Mouth:  No deformity or lesions,oropharynx pink & moist. Neck:  Supple; no masses or thyromegaly. Lungs:  Respirations even and unlabored.  Clear throughout to auscultation.   No wheezes, crackles, or rhonchi. No acute distress. Heart:  Regular rate and rhythm; no murmurs, clicks, rubs, or gallops. Abdomen:  Normal bowel sounds.  No bruits.  Soft, non-tender and non-distended without masses, hepatosplenomegaly or hernias noted.  No guarding or rebound tenderness.    Neurologic:  Alert and oriented x3;  grossly normal neurologically. Skin:  Intact without significant lesions or rashes. No jaundice. Lymph Nodes:  No significant cervical adenopathy. Psych:  Alert and cooperative. Normal mood and affect.  Imaging Studies: No results found.  Assessment and Plan:   Jordan Gaines is a 63 y.o. y/o male has been referred for GERD, Also due to screening colonoscopy    1. GERD : Counseled on life style changes, suggest to use PPI first thing in the morning on empty stomach and eat 30 minutes after. Advised on the use of a wedge pillow at night , avoid meals for 2 hours prior to bed time. Weight loss .Discussed the risks and  benefits of long term PPI use including but not limited to bone loss, chronic kidney disease, infections , low magnesium . Aim to use at the lowest dose for the shortest period of time  Patient information provided.   Stop Prilosec and start on Pepcid 20 mg BID(fewer side effects)- if works then will decrease to once daily at night at next visit.   2. Screening colonoscopy    I have discussed alternative options, risks & benefits,  which include, but are not limited to, bleeding, infection, perforation,respiratory complication & drug reaction.  The patient agrees with this plan & written consent will be obtained.    Follow up in 5 months   Dr Jordan Bellows MD,MRCP(U.K)

## 2018-10-18 NOTE — Patient Instructions (Signed)

## 2018-10-19 ENCOUNTER — Telehealth: Payer: Self-pay | Admitting: Gastroenterology

## 2018-10-19 NOTE — Telephone Encounter (Signed)
Returned pt call regarding his questions about the reflux medication.  Unable to contact, LVM to return call

## 2018-10-19 NOTE — Telephone Encounter (Signed)
The patient had a recent Kindey infection & is scheduled to see the Urologist.

## 2018-10-19 NOTE — Telephone Encounter (Signed)
Patient called & has seen by Dr Vicente Males  He prescribed famotidine that has side effects for kidneys. He uses [facility]. He also has questions about the medication he was taken for reflux that stated with an O. Please call & l/m if he does not answer.

## 2018-10-20 NOTE — Telephone Encounter (Signed)
In comparison to Prilosec, pepcid is safer. Ok to use with recent kidney infection

## 2018-10-20 NOTE — Telephone Encounter (Signed)
Called pt to inform him of Dr. Georgeann Oppenheim advice.  Unable to contact, left detailed VM

## 2018-10-21 LAB — UA/M W/RFLX CULTURE, ROUTINE
Bilirubin, UA: NEGATIVE
Glucose, UA: NEGATIVE
Ketones, UA: NEGATIVE
Nitrite, UA: NEGATIVE
Specific Gravity, UA: 1.025 (ref 1.005–1.030)
Urobilinogen, Ur: 0.2 mg/dL (ref 0.2–1.0)
pH, UA: 5.5 (ref 5.0–7.5)

## 2018-10-21 LAB — MICROSCOPIC EXAMINATION: WBC, UA: >30 /hpf — AB (ref 0–5)

## 2018-10-21 LAB — URINE CULTURE, REFLEX

## 2018-10-24 ENCOUNTER — Ambulatory Visit: Payer: 59 | Admitting: Nurse Practitioner

## 2018-10-25 ENCOUNTER — Other Ambulatory Visit
Admission: RE | Admit: 2018-10-25 | Discharge: 2018-10-25 | Disposition: A | Discharge status: Home / Self Care | Payer: BC Managed Care – PPO | Source: Ambulatory Visit | Attending: Gastroenterology | Admitting: Gastroenterology

## 2018-10-25 ENCOUNTER — Other Ambulatory Visit: Payer: Self-pay

## 2018-10-25 DIAGNOSIS — Z1159 Encounter for screening for other viral diseases: Secondary | ICD-10-CM | POA: Insufficient documentation

## 2018-10-26 LAB — SARS CORONAVIRUS 2 (TAT 6-24 HRS): SARS Coronavirus 2: NEGATIVE

## 2018-10-28 ENCOUNTER — Ambulatory Visit: Payer: 59 | Admitting: Anesthesiology

## 2018-10-28 ENCOUNTER — Other Ambulatory Visit: Payer: Self-pay

## 2018-10-28 ENCOUNTER — Ambulatory Visit
Admission: RE | Admit: 2018-10-28 | Discharge: 2018-10-28 | Disposition: A | Discharge status: Home / Self Care | Payer: 59 | Attending: Gastroenterology | Admitting: Gastroenterology

## 2018-10-28 ENCOUNTER — Encounter
Admission: RE | Disposition: A | Discharge status: Home / Self Care | Payer: Self-pay | Source: Home / Self Care | Attending: Gastroenterology

## 2018-10-28 ENCOUNTER — Encounter: Payer: Self-pay | Admitting: *Deleted

## 2018-10-28 DIAGNOSIS — K5730 Diverticulosis of large intestine without perforation or abscess without bleeding: Secondary | ICD-10-CM | POA: Insufficient documentation

## 2018-10-28 DIAGNOSIS — K648 Other hemorrhoids: Secondary | ICD-10-CM | POA: Diagnosis not present

## 2018-10-28 DIAGNOSIS — Z1211 Encounter for screening for malignant neoplasm of colon: Secondary | ICD-10-CM

## 2018-10-28 DIAGNOSIS — K5790 Diverticulosis of intestine, part unspecified, without perforation or abscess without bleeding: Secondary | ICD-10-CM | POA: Diagnosis not present

## 2018-10-28 DIAGNOSIS — D120 Benign neoplasm of cecum: Secondary | ICD-10-CM | POA: Insufficient documentation

## 2018-10-28 DIAGNOSIS — K635 Polyp of colon: Secondary | ICD-10-CM

## 2018-10-28 DIAGNOSIS — D122 Benign neoplasm of ascending colon: Secondary | ICD-10-CM | POA: Diagnosis not present

## 2018-10-28 DIAGNOSIS — Z79899 Other long term (current) drug therapy: Secondary | ICD-10-CM | POA: Diagnosis not present

## 2018-10-28 DIAGNOSIS — K219 Gastro-esophageal reflux disease without esophagitis: Secondary | ICD-10-CM | POA: Insufficient documentation

## 2018-10-28 HISTORY — PX: COLONOSCOPY WITH PROPOFOL: SHX5780

## 2018-10-28 HISTORY — DX: Gastro-esophageal reflux disease without esophagitis: K21.9

## 2018-10-28 SURGERY — COLONOSCOPY WITH PROPOFOL
Anesthesia: General

## 2018-10-28 MED ORDER — MIDAZOLAM HCL 2 MG/2ML IJ SOLN
INTRAMUSCULAR | Status: AC
  Filled 2018-10-28: qty 2

## 2018-10-28 MED ORDER — SODIUM CHLORIDE 0.9 % IV SOLN
INTRAVENOUS | Status: DC
  Administered 2018-10-28: 09:00:00 via INTRAVENOUS

## 2018-10-28 MED ORDER — FENTANYL CITRATE (PF) 100 MCG/2ML IJ SOLN
INTRAMUSCULAR | Status: DC | PRN
  Administered 2018-10-28 (×2): 50 ug via INTRAVENOUS

## 2018-10-28 MED ORDER — MIDAZOLAM HCL 5 MG/5ML IJ SOLN
INTRAMUSCULAR | Status: DC | PRN
  Administered 2018-10-28: 2 mg via INTRAVENOUS

## 2018-10-28 MED ORDER — PROPOFOL 10 MG/ML IV BOLUS
INTRAVENOUS | Status: DC | PRN
  Administered 2018-10-28: 30 mg via INTRAVENOUS
  Administered 2018-10-28: 20 mg via INTRAVENOUS

## 2018-10-28 MED ORDER — PROPOFOL 500 MG/50ML IV EMUL
INTRAVENOUS | Status: DC | PRN
  Administered 2018-10-28: 50 ug/kg/min via INTRAVENOUS

## 2018-10-28 MED ORDER — FENTANYL CITRATE (PF) 100 MCG/2ML IJ SOLN
INTRAMUSCULAR | Status: AC
  Filled 2018-10-28: qty 2

## 2018-10-28 MED ORDER — SODIUM CHLORIDE (PF) 0.9 % IJ SOLN
INTRAMUSCULAR | Status: DC | PRN
  Administered 2018-10-28: 10 mL via INTRAVENOUS

## 2018-10-28 MED ORDER — LIDOCAINE HCL (PF) 2 % IJ SOLN
INTRAMUSCULAR | Status: DC | PRN
  Administered 2018-10-28: 100 mg

## 2018-10-28 MED ORDER — LIDOCAINE HCL (PF) 2 % IJ SOLN
INTRAMUSCULAR | Status: AC
  Filled 2018-10-28: qty 10

## 2018-10-28 NOTE — Transfer of Care (Signed)
Immediate Anesthesia Transfer of Care Note  Patient: Jordan Gaines  Procedure(s) Performed: COLONOSCOPY WITH PROPOFOL (N/A )  Patient Location: PACU  Anesthesia Type:General  Level of Consciousness: sedated  Airway & Oxygen Therapy: Patient Spontanous Breathing and Patient connected to nasal cannula oxygen  Post-op Assessment: Report given to RN and Post -op Vital signs reviewed and stable  Post vital signs: Reviewed and stable  Last Vitals:  Vitals Value Taken Time  BP    Temp    Pulse    Resp    SpO2      Last Pain:  Vitals:   10/28/18 0834  TempSrc: Tympanic  PainSc: 0-No pain         Complications: No apparent anesthesia complications

## 2018-10-28 NOTE — Anesthesia Post-op Follow-up Note (Signed)
Anesthesia QCDR form completed.        

## 2018-10-28 NOTE — Op Note (Signed)
[facility] Gastroenterology Patient Name: Jordan Gaines Procedure Date: 10/28/2018 9:22 AM MRN: [id number] Account #: [id number] Date of Birth: [birth year] Admit Type: Outpatient Age: 63 Room: [facility] ENDO ROOM 3 Gender: Male Note Status: Finalized Procedure:            Colonoscopy Indications:          Screening for colorectal malignant neoplasm Providers:            [name] MD, MD Medicines:            Monitored Anesthesia Care Complications:        No immediate complications. Procedure:            Pre-Anesthesia Assessment:                       - Prior to the procedure, a History and Physical was                        performed, and patient medications, allergies and                        sensitivities were reviewed. The patient's tolerance of                        previous anesthesia was reviewed.                       - The risks and benefits of the procedure and the                        sedation options and risks were discussed with the                        patient. All questions were answered and informed                        consent was obtained.                       - ASA Grade Assessment: II - A patient with mild                        systemic disease.                       After obtaining informed consent, the colonoscope was                        passed under direct vision. Throughout the procedure,                        the patient's blood pressure, pulse, and oxygen                        saturations were monitored continuously. The                        Colonoscope was introduced through the anus and                        advanced to the the cecum, identified by the  appendiceal orifice, IC valve and transillumination.                        The colonoscopy was performed with ease. The patient                        tolerated the procedure well. The quality of the bowel                        preparation was  adequate. Findings:      The perianal and digital rectal examinations were normal.      A 6 mm polyp was found in the ascending colon. The polyp was sessile.       The polyp was removed with a cold snare. Resection and retrieval were       complete.      A 12 mm polyp was found in the cecum. The polyp was sessile.       Preparations were made for mucosal resection. 7 mL of saline was       injected with adequate lift of the lesion from the muscularis propria.       Snare mucosal resection with suction (via the working channel) retrieval       was performed. A 14 mm area was resected. Resection and retrieval were       complete. There was no bleeding during and at the end of the procedure.       To prevent bleeding after mucosal resection, one hemostatic clip was       successfully placed. There was no bleeding during, or at the end, of the       procedure.      Multiple small-mouthed diverticula were found in the left colon.      Non-bleeding internal hemorrhoids were found during retroflexion. The       hemorrhoids were medium-sized.      The exam was otherwise without abnormality on direct and retroflexion       views. Impression:           - One 6 mm polyp in the ascending colon, removed with a                        cold snare. Resected and retrieved.                       - One 12 mm polyp in the cecum, removed with mucosal                        resection. Resected and retrieved. Clip was placed.                       - Diverticulosis in the left colon.                       - Non-bleeding internal hemorrhoids.                       - The examination was otherwise normal on direct and                        retroflexion views.                       -  Mucosal resection was performed. Resection and                        retrieval were complete. Recommendation:       - Discharge patient to home (with escort).                       - Resume previous diet.                       -  Continue present medications.                       - Await pathology results.                       - Repeat colonoscopy in 3 years for surveillance. Procedure Code(s):    --- Professional ---                       [phone], Colonoscopy, flexible; with endoscopic mucosal                        resection                       45385, 77, Colonoscopy, flexible; with removal of                        tumor(s), polyp(s), or other lesion(s) by snare                        technique Diagnosis Code(s):    --- Professional ---                       Z12.11, Encounter for screening for malignant neoplasm                        of colon                       K63.5, Polyp of colon                       K64.8, Other hemorrhoids                       K57.30, Diverticulosis of large intestine without                        perforation or abscess without bleeding CPT copyright 2019 American Medical Association. All rights reserved. The codes documented in this report are preliminary and upon coder review may  be revised to meet current compliance requirements. [name], MD [name] MD, MD 10/28/2018 9:59:08 AM This report has been signed electronically. Number of Addenda: 0 Note Initiated On: 10/28/2018 9:22 AM Scope Withdrawal Time: 0 hours 15 minutes 39 seconds  Total Procedure Duration: 0 hours 19 minutes 21 seconds  Estimated Blood Loss: Estimated blood loss: none.      [facility]

## 2018-10-28 NOTE — Anesthesia Preprocedure Evaluation (Signed)
Anesthesia Evaluation  Patient identified by MRN, date of birth, ID band Patient awake    Reviewed: Allergy & Precautions, H&P , NPO status , Patient's Chart, lab work & pertinent test results  Airway Mallampati: III  TM Distance: <3 FB Neck ROM: limited    Dental  (+) Chipped, Poor Dentition, Missing, Upper Dentures   Pulmonary neg pulmonary ROS, neg shortness of breath,           Cardiovascular Exercise Tolerance: Good (-) Past MI negative cardio ROS       Neuro/Psych negative neurological ROS  negative psych ROS   GI/Hepatic Neg liver ROS, GERD  Medicated and Controlled,  Endo/Other  negative endocrine ROS  Renal/GU Renal disease  negative genitourinary   Musculoskeletal   Abdominal   Peds  Hematology negative hematology ROS (+)   Anesthesia Other Findings Past Medical History: No date: GERD (gastroesophageal reflux disease) No date: Kidney stones  Past Surgical History: No date: BLADDER SURGERY No date: TONSILLECTOMY  BMI    Body Mass Index: 37.23 kg/m      Reproductive/Obstetrics negative OB ROS                             Anesthesia Physical Anesthesia Plan  ASA: III  Anesthesia Plan: General   Post-op Pain Management:    Induction: Intravenous  PONV Risk Score and Plan: Propofol infusion and TIVA  Airway Management Planned: Natural Airway and Nasal Cannula  Additional Equipment:   Intra-op Plan:   Post-operative Plan:   Informed Consent: I have reviewed the patients History and Physical, chart, labs and discussed the procedure including the risks, benefits and alternatives for the proposed anesthesia with the patient or authorized representative who has indicated his/her understanding and acceptance.     Dental Advisory Given  Plan Discussed with: Anesthesiologist, CRNA and Surgeon  Anesthesia Plan Comments: (Patient consented for risks of anesthesia  including but not limited to:  - adverse reactions to medications - risk of intubation if required - damage to teeth, lips or other oral mucosa - sore throat or hoarseness - Damage to heart, brain, lungs or loss of life  Patient voiced understanding.)        Anesthesia Quick Evaluation

## 2018-10-28 NOTE — Anesthesia Postprocedure Evaluation (Signed)
Anesthesia Post Note  Patient: Jordan Gaines  Procedure(s) Performed: COLONOSCOPY WITH PROPOFOL (N/A )  Patient location during evaluation: Endoscopy Anesthesia Type: General Level of consciousness: awake and alert Pain management: pain level controlled Vital Signs Assessment: post-procedure vital signs reviewed and stable Respiratory status: spontaneous breathing, nonlabored ventilation, respiratory function stable and patient connected to nasal cannula oxygen Cardiovascular status: blood pressure returned to baseline and stable Postop Assessment: no apparent nausea or vomiting Anesthetic complications: no     Last Vitals:  Vitals:   10/28/18 0959 10/28/18 1009  BP: 112/70 117/80  Pulse: 64 (!) 58  Resp: 16 14  Temp: (!) 36 C   SpO2: 95% 94%    Last Pain:  Vitals:   10/28/18 1009  TempSrc:   PainSc: 0-No pain                 Precious Haws Vicke Plotner

## 2018-10-28 NOTE — H&P (Signed)
[name], MD [address] [place], [place], [place], [address] [address], [place] [employer], [place], [place], [address] Phone: [phone]  Fax: [phone]  Primary Care Physician:  Venita Lick, NP   Pre-Procedure History & Physical: HPI:  Jordan Gaines is a 63 y.o. male is here for an colonoscopy.   Past Medical History:  Diagnosis Date  . GERD (gastroesophageal reflux disease)   . Kidney stones     Past Surgical History:  Procedure Laterality Date  . BLADDER SURGERY    . TONSILLECTOMY      Prior to Admission medications   Medication Sig Start Date End Date Taking? Authorizing Provider  famotidine (PEPCID) 20 MG tablet Take 1 tablet (20 mg total) by mouth 2 (two) times daily. 10/18/18 01/18/19 Yes [name], MD  nystatin cream (MYCOSTATIN) Apply 1 application topically 2 (two) times daily. 10/17/18  Yes Cannady, Jolene T, NP  atorvastatin (LIPITOR) 10 MG tablet Take 1 tablet (10 mg total) by mouth daily at 6 PM. Patient not taking: Reported on 10/18/2018 09/20/18   Marnee Guarneri T, NP  omeprazole (PRILOSEC) 20 MG capsule Take 1 capsule (20 mg total) by mouth daily. Patient not taking: Reported on 10/28/2018 09/19/18   Marnee Guarneri T, NP    Allergies as of 10/18/2018  . (No Known Allergies)    Family History  Problem Relation Age of Onset  . Diabetes Mother   . Heart disease Mother   . Diabetes Father   . Multiple sclerosis Son   . Heart disease Maternal Grandfather     Social History   Socioeconomic History  . Marital status: Significant Other    Spouse name: Not on file  . Number of children: Not on file  . Years of education: Not on file  . Highest education level: Not on file  Occupational History  . Occupation: Armed forces logistics/support/administrative officer    Comment: [employer]  Social Needs  . Financial resource strain: Not hard at all  . Food insecurity    Worry: Never true    Inability: Never true  . Transportation needs    Medical: No    Non-medical: No   Tobacco Use  . Smoking status: Never Smoker  . Smokeless tobacco: Never Used  Substance and Sexual Activity  . Alcohol use: Yes    Frequency: Never    Comment: rarely  . Drug use: Never  . Sexual activity: Yes  Lifestyle  . Physical activity    Days per week: 3 days    Minutes per session: 20 min  . Stress: Not at all  Relationships  . Social connections    Talks on phone: More than three times a week    Gets together: More than three times a week    Attends religious service: 1 to 4 times per year    Active member of club or organization: No    Attends meetings of clubs or organizations: Never    Relationship status: Divorced  . Intimate partner violence    Fear of current or ex partner: No    Emotionally abused: No    Physically abused: No    Forced sexual activity: No  Other Topics Concern  . Not on file  Social History Narrative  . Not on file    Review of Systems: See HPI, otherwise negative ROS  Physical Exam: BP 138/84   Pulse 69   Temp 97.9 F (36.6 C) (Tympanic)   Resp 18  Ht 6\' 2"  (1.88 m)   Wt 131.5 kg   SpO2 96%   BMI 37.23 kg/m  General:   Alert,  pleasant and cooperative in NAD Head:  Normocephalic and atraumatic. Neck:  Supple; no masses or thyromegaly. Lungs:  Clear throughout to auscultation, normal respiratory effort.    Heart:  +S1, +S2, Regular rate and rhythm, No edema. Abdomen:  Soft, nontender and nondistended. Normal bowel sounds, without guarding, and without rebound.   Neurologic:  Alert and  oriented x4;  grossly normal neurologically.  Impression/Plan: IRVIN BASTIN is here for an colonoscopy to be performed for Screening colonoscopy average risk   Risks, benefits, limitations, and alternatives regarding  colonoscopy have been reviewed with the patient.  Questions have been answered.  All parties agreeable.   [name], MD  10/28/2018, 9:22 AM

## 2018-10-31 ENCOUNTER — Encounter: Payer: Self-pay | Admitting: Gastroenterology

## 2018-10-31 LAB — SURGICAL PATHOLOGY

## 2018-11-06 ENCOUNTER — Encounter: Payer: Self-pay | Admitting: Gastroenterology

## 2018-11-15 ENCOUNTER — Other Ambulatory Visit: Payer: Self-pay

## 2018-11-15 ENCOUNTER — Encounter: Payer: Self-pay | Admitting: Urology

## 2018-11-15 ENCOUNTER — Ambulatory Visit (INDEPENDENT_AMBULATORY_CARE_PROVIDER_SITE_OTHER): Payer: 59 | Admitting: Urology

## 2018-11-15 VITALS — BP 139/64 | HR 69 | Ht 74.0 in | Wt 301.0 lb

## 2018-11-15 DIAGNOSIS — R3129 Other microscopic hematuria: Secondary | ICD-10-CM | POA: Diagnosis not present

## 2018-11-15 DIAGNOSIS — Z87442 Personal history of urinary calculi: Secondary | ICD-10-CM

## 2018-11-15 DIAGNOSIS — R8281 Pyuria: Secondary | ICD-10-CM

## 2018-11-15 LAB — MICROSCOPIC EXAMINATION
RBC, Urine: NONE SEEN /hpf (ref 0–2)
WBC, UA: >30 /hpf — AB (ref 0–5)

## 2018-11-15 LAB — URINALYSIS, COMPLETE
Bilirubin, UA: NEGATIVE
Glucose, UA: NEGATIVE
Ketones, UA: NEGATIVE
Nitrite, UA: NEGATIVE
Specific Gravity, UA: 1.025 (ref 1.005–1.030)
Urobilinogen, Ur: 0.2 mg/dL (ref 0.2–1.0)
pH, UA: 5.5 (ref 5.0–7.5)

## 2018-11-15 NOTE — Progress Notes (Signed)
11/15/2018 1:54 PM   [name] [birth year] [id number]  Referring provider: Venita Lick, NP [address] [place],  [place] [address]  Chief Complaint  Patient presents with  . Hematuria    New Patient    HPI: Jordan Gaines is a 63 y.o. male seen in consultation at the request of Marnee Guarneri, NP for evaluation of microhematuria and pyuria.  Back in late May/early June he noted small dark flecks of blood in his urine with slight increased odor.  Urinalysis showed >30 WBC and RBCs with many bacteria.  He was treated empirically with Cipro.  Urine culture grew mixed flora.  A PSA was 0.7.  A follow-up urinalysis on 6/18 showed >30 RBCs and a repeat urinalysis in early July showed 11-30 RBCs and >30 WBCs.  Repeat culture grew lactobacillus.  Denies bothersome lower urinary tract symptoms.  He had 2 UTIs several years ago.  He also had an outlet procedure approximately 25 years ago which sounds like either a TURP or treatment of a bladder neck contracture.  He also had a stone in 2007 which he passed.   PMH: Past Medical History:  Diagnosis Date  . GERD (gastroesophageal reflux disease)   . Kidney stones     Surgical History: Past Surgical History:  Procedure Laterality Date  . BLADDER SURGERY    . COLONOSCOPY WITH PROPOFOL N/A 10/28/2018   Procedure: COLONOSCOPY WITH PROPOFOL;  Surgeon: [name], MD;  Location: [facility] ENDOSCOPY;  Service: Gastroenterology;  Laterality: N/A;  . TONSILLECTOMY      Home Medications:  Allergies as of 11/15/2018   No Known Allergies     Medication List       Accurate as of November 15, 2018  1:54 PM. If you have any questions, ask your nurse or doctor.        atorvastatin 10 MG tablet Commonly known as: LIPITOR Take 1 tablet (10 mg total) by mouth daily at 6 PM.   famotidine 20 MG tablet Commonly known as: Pepcid Take 1 tablet (20 mg total) by mouth 2 (two) times daily.   nystatin cream Commonly known as: MYCOSTATIN Apply 1  application topically 2 (two) times daily.   omeprazole 20 MG capsule Commonly known as: PriLOSEC Take 1 capsule (20 mg total) by mouth daily.       Allergies: No Known Allergies  Family History: Family History  Problem Relation Age of Onset  . Diabetes Mother   . Heart disease Mother   . Diabetes Father   . Multiple sclerosis Son   . Heart disease Maternal Grandfather     Social History:  reports that he has never smoked. He has never used smokeless tobacco. He reports current alcohol use. He reports that he does not use drugs.  ROS: UROLOGY Frequent Urination?: No Hard to postpone urination?: No Burning/pain with urination?: No Get up at night to urinate?: No Leakage of urine?: Yes Urine stream starts and stops?: No Trouble starting stream?: No Do you have to strain to urinate?: No Blood in urine?: Yes Urinary tract infection?: No Sexually transmitted disease?: No Injury to kidneys or bladder?: No Painful intercourse?: No Weak stream?: No Erection problems?: No Penile pain?: No  Gastrointestinal Nausea?: No Vomiting?: No Indigestion/heartburn?: No Diarrhea?: No Constipation?: No  Constitutional Fever: No Night sweats?: No Weight loss?: No Fatigue?: No  Skin Skin rash/lesions?: No Itching?: No  Eyes Blurred vision?: No Double vision?: No  Ears/Nose/Throat Sore throat?: No Sinus problems?: No  Hematologic/Lymphatic  Swollen glands?: No Easy bruising?: No  Cardiovascular Leg swelling?: No Chest pain?: No  Respiratory Cough?: No Shortness of breath?: No  Endocrine Excessive thirst?: No  Musculoskeletal Back pain?: No Joint pain?: No  Neurological Headaches?: No Dizziness?: No  Psychologic Depression?: No Anxiety?: No  Physical Exam: BP 139/64   Pulse 69   Ht 6\' 2"  (1.88 m)   Wt (!) 301 lb (136.5 kg)   BMI 38.65 kg/m   Constitutional:  Alert and oriented, No acute distress. HEENT: [place] AT, moist mucus membranes.  Trachea  midline, no masses. Cardiovascular: No clubbing, cyanosis, or edema. Respiratory: Normal respiratory effort, no increased work of breathing. GI: Abdomen is soft, nontender, nondistended, no abdominal masses GU: No CVA tenderness.  Phallus without lesions, meatus normal in appearance.  Testes descended bilaterally without masses or tenderness.  Prostate 35 g, smooth without nodules. Lymph: No cervical or inguinal lymphadenopathy. Skin: No rashes, bruises or suspicious lesions. Neurologic: Grossly intact, no focal deficits, moving all 4 extremities. Psychiatric: Normal mood and affect.  Laboratory Data:  Urinalysis Dipstick trace blood, 1+ leukocyte Microscopy >30 WBC, moderate bacteria  Assessment & Plan:    -Sterile pyuria/microscopic hematuria Will repeat urine culture today although will most likely be negative.  I recommended further evaluation of the upper and lower tracts with CT urogram and cystoscopy.  The procedures were discussed in detail and he elected to proceed.   Abbie Sons, [place] [address] [place], [place] [address] [phone]

## 2018-11-17 LAB — CULTURE, URINE COMPREHENSIVE

## 2018-11-22 ENCOUNTER — Telehealth: Payer: Self-pay

## 2018-11-22 ENCOUNTER — Telehealth: Payer: Self-pay | Admitting: Urology

## 2018-11-22 DIAGNOSIS — N3000 Acute cystitis without hematuria: Secondary | ICD-10-CM

## 2018-11-22 NOTE — Telephone Encounter (Signed)
Can you review this pt's urine cx and advise so that I can call him. Pt has called and is upset no one has reached out to him about results. Thanks

## 2018-11-22 NOTE — Telephone Encounter (Signed)
Pt hasn't heard anything about U/A and culture from 8/4 and was wondering about results.

## 2018-11-22 NOTE — Telephone Encounter (Signed)
Duplicate See previous encounter

## 2018-11-23 NOTE — Telephone Encounter (Signed)
This may be a contaminant but can go ahead and treat with amoxicillin 875 mg twice daily for 7 days

## 2018-11-24 ENCOUNTER — Other Ambulatory Visit: Payer: Self-pay

## 2018-11-24 ENCOUNTER — Ambulatory Visit
Admission: RE | Admit: 2018-11-24 | Discharge: 2018-11-24 | Disposition: A | Discharge status: Home / Self Care | Payer: 59 | Source: Ambulatory Visit | Attending: Urology | Admitting: Urology

## 2018-11-24 DIAGNOSIS — R3129 Other microscopic hematuria: Secondary | ICD-10-CM

## 2018-11-24 DIAGNOSIS — N200 Calculus of kidney: Secondary | ICD-10-CM | POA: Diagnosis not present

## 2018-11-24 LAB — POCT I-STAT CREATININE: Creatinine, Ser: 1.2 mg/dL (ref 0.61–1.24)

## 2018-11-24 MED ORDER — IOHEXOL 300 MG/ML  SOLN
125.0000 mL | Freq: Once | INTRAMUSCULAR | Status: AC | PRN
  Administered 2018-11-24: 125 mL via INTRAVENOUS

## 2018-11-28 ENCOUNTER — Encounter: Payer: Self-pay | Admitting: Urology

## 2018-11-28 MED ORDER — AMOXICILLIN-POT CLAVULANATE 875-125 MG PO TABS: 1.0000 | ORAL_TABLET | Freq: Two times a day (BID) | ORAL | 0 refills | Status: DC

## 2018-11-28 NOTE — Telephone Encounter (Signed)
Called pt informed him of the information below. Pt gave verbal understanding. RX sent in.  °

## 2018-12-01 ENCOUNTER — Encounter: Payer: Self-pay | Admitting: Urology

## 2018-12-01 ENCOUNTER — Ambulatory Visit (INDEPENDENT_AMBULATORY_CARE_PROVIDER_SITE_OTHER): Payer: 59 | Admitting: Urology

## 2018-12-01 ENCOUNTER — Other Ambulatory Visit: Payer: Self-pay

## 2018-12-01 VITALS — BP 141/73 | HR 86 | Ht 74.0 in | Wt 311.4 lb

## 2018-12-01 DIAGNOSIS — N200 Calculus of kidney: Secondary | ICD-10-CM

## 2018-12-01 DIAGNOSIS — R3129 Other microscopic hematuria: Secondary | ICD-10-CM | POA: Diagnosis not present

## 2018-12-01 DIAGNOSIS — R935 Abnormal findings on diagnostic imaging of other abdominal regions, including retroperitoneum: Secondary | ICD-10-CM

## 2018-12-01 LAB — MICROSCOPIC EXAMINATION: Bacteria, UA: NONE SEEN

## 2018-12-01 LAB — URINALYSIS, COMPLETE
Bilirubin, UA: NEGATIVE
Glucose, UA: NEGATIVE
Ketones, UA: NEGATIVE
Leukocytes,UA: NEGATIVE
Nitrite, UA: NEGATIVE
Protein,UA: NEGATIVE
Specific Gravity, UA: 1.025 (ref 1.005–1.030)
Urobilinogen, Ur: 0.2 mg/dL (ref 0.2–1.0)
pH, UA: 6.5 (ref 5.0–7.5)

## 2018-12-01 MED ORDER — LIDOCAINE HCL URETHRAL/MUCOSAL 2 % EX GEL
1.0000 "application " | Freq: Once | CUTANEOUS | Status: AC
  Administered 2018-12-01: 1 via URETHRAL

## 2018-12-03 NOTE — Progress Notes (Signed)
   12/01/2018 4:18 PM   [name] 07/19/1955 [id number]  Referring provider: Venita Lick, NP [address] [place],  [place] [address]  Chief Complaint  Patient presents with  . Hematuria  . Cysto    HPI: 63 y.o. male with history of hematuria and pyuria who on cystoscopy performed today found to have an anterior urethral stricture.  He presents for cystoscopy under anesthesia with urethral stricture dilation.   PMH: Past Medical History:  Diagnosis Date  . GERD (gastroesophageal reflux disease)   . Kidney stones     Surgical History: Past Surgical History:  Procedure Laterality Date  . BLADDER SURGERY    . COLONOSCOPY WITH PROPOFOL N/A 10/28/2018   Procedure: COLONOSCOPY WITH PROPOFOL;  Surgeon: [name], MD;  Location: [facility] ENDOSCOPY;  Service: Gastroenterology;  Laterality: N/A;  . TONSILLECTOMY      Home Medications:  Allergies as of 12/01/2018   No Known Allergies     Medication List       Accurate as of December 01, 2018 11:59 PM. If you have any questions, ask your nurse or doctor.        amoxicillin-clavulanate 875-125 MG tablet Commonly known as: AUGMENTIN Take 1 tablet by mouth 2 (two) times daily.   atorvastatin 10 MG tablet Commonly known as: LIPITOR Take 1 tablet (10 mg total) by mouth daily at 6 PM.   famotidine 20 MG tablet Commonly known as: Pepcid Take 1 tablet (20 mg total) by mouth 2 (two) times daily.   nystatin cream Commonly known as: MYCOSTATIN Apply 1 application topically 2 (two) times daily.   omeprazole 20 MG capsule Commonly known as: PriLOSEC Take 1 capsule (20 mg total) by mouth daily.       Allergies: No Known Allergies  Family History: Family History  Problem Relation Age of Onset  . Diabetes Mother   . Heart disease Mother   . Diabetes Father   . Multiple sclerosis Son   . Heart disease Maternal Grandfather     Social History:  reports that he has never smoked. He has never used smokeless tobacco. He  reports current alcohol use. He reports that he does not use drugs.  ROS: No significant change 11/15/2018  Physical Exam: BP (!) 141/73 (BP Location: Left Arm, Patient Position: Sitting, Cuff Size: Large)   Pulse 86   Ht 6\' 2"  (1.88 m)   Wt (!) 311 lb 6.4 oz (141.3 kg)   BMI 39.98 kg/m   Constitutional:  Alert and oriented, No acute distress. HEENT: [place] AT, moist mucus membranes.  Trachea midline, no masses. Cardiovascular: No clubbing, cyanosis, or edema.  RRR Respiratory: Normal respiratory effort, no increased work of breathing.  Clear GI: Abdomen is soft, nontender, nondistended, no abdominal masses GU: No CVA tenderness Lymph: No cervical or inguinal lymphadenopathy. Skin: No rashes, bruises or suspicious lesions. Neurologic: Grossly intact, no focal deficits, moving all 4 extremities. Psychiatric: Normal mood and affect.   Assessment & Plan:    63 year-old male with an anterior urethral stricture presents for cystoscopy with stricture dilation.  The procedure has been discussed in detail as per my procedure note of this date.  Abbie Sons, [place] [address] [employer], [place] [address] [phone]

## 2018-12-03 NOTE — Progress Notes (Signed)
   12/03/18  CC:  Chief Complaint  Patient presents with  . Hematuria  . Cysto    HPI: [age]YO male initially seen 11/15/2018 for recurrent sterile pyuria, microhematuria with intermittent gross hematuria.  Blood pressure (!) 141/73, pulse 86, height 6\' 2"  (1.88 m), weight (!) 311 lb 6.4 oz (141.3 kg). NED. A&Ox3.   No respiratory distress   Abd soft, NT, ND Normal phallus with bilateral descended testicles  Imaging: CTU was personally reviewed.  3 left lower pole nonobstructing renal calculi.  Large right bladder diverticulum  Incidentally noted to have sclerotic lesions right ilium and right pubic bone felt to be indeterminate.  Bone scan was recommended.  Cystoscopy Procedure Note  Patient identification was confirmed, informed consent was obtained, and patient was prepped using Betadine solution.  Lidocaine jelly was administered per urethral meatus.     Pre-Procedure: - Inspection reveals a normal caliber urethral meatus.  Procedure: The flexible cystoscope was introduced without difficulty - A stricture was identified in the anterior urethra which would not allow passage of the flexible cystoscope.  Estimated at 10 FR.  Remainder of cystoscopy unable to be completed   Post-Procedure: - Patient tolerated the procedure well  Assessment/ Plan:  - Anterior urethral stricture Findings were discussed with Mr. Olshefski and I recommended scheduling cystoscopy with urethral dilation which he requested to have done under anesthesia.  The procedure was discussed in detail with the likelihood of stricture recurrence.  Potential bleeding and infection were also discussed.  He was amenable to additional treatments if abnormalities are found in the bladder including biopsy.  He indicated all questions were answered and desires to schedule.  - Sclerotic bone lesions pelvis Felt to be indeterminate.  Bone scan recommended by radiology.  Abbie Sons, MD

## 2018-12-03 NOTE — H&P (View-Only) (Signed)
   12/01/2018 4:18 PM   Jordan Gaines Apr 14, 1955 [id number]  Referring provider: Venita Lick, NP [address] [place],  [place] [address]  Chief Complaint  Patient presents with  . Hematuria  . Cysto    HPI: 63 y.o. male with history of hematuria and pyuria who on cystoscopy performed today found to have an anterior urethral stricture.  He presents for cystoscopy under anesthesia with urethral stricture dilation.   PMH: Past Medical History:  Diagnosis Date  . GERD (gastroesophageal reflux disease)   . Kidney stones     Surgical History: Past Surgical History:  Procedure Laterality Date  . BLADDER SURGERY    . COLONOSCOPY WITH PROPOFOL N/A 10/28/2018   Procedure: COLONOSCOPY WITH PROPOFOL;  Surgeon: [name], MD;  Location: [facility] ENDOSCOPY;  Service: Gastroenterology;  Laterality: N/A;  . TONSILLECTOMY      Home Medications:  Allergies as of 12/01/2018   No Known Allergies     Medication List       Accurate as of December 01, 2018 11:59 PM. If you have any questions, ask your nurse or doctor.        amoxicillin-clavulanate 875-125 MG tablet Commonly known as: AUGMENTIN Take 1 tablet by mouth 2 (two) times daily.   atorvastatin 10 MG tablet Commonly known as: LIPITOR Take 1 tablet (10 mg total) by mouth daily at 6 PM.   famotidine 20 MG tablet Commonly known as: Pepcid Take 1 tablet (20 mg total) by mouth 2 (two) times daily.   nystatin cream Commonly known as: MYCOSTATIN Apply 1 application topically 2 (two) times daily.   omeprazole 20 MG capsule Commonly known as: PriLOSEC Take 1 capsule (20 mg total) by mouth daily.       Allergies: No Known Allergies  Family History: Family History  Problem Relation Age of Onset  . Diabetes Mother   . Heart disease Mother   . Diabetes Father   . Multiple sclerosis Son   . Heart disease Maternal Grandfather     Social History:  reports that he has never smoked. He has never used smokeless tobacco. He  reports current alcohol use. He reports that he does not use drugs.  ROS: No significant change 11/15/2018  Physical Exam: BP (!) 141/73 (BP Location: Left Arm, Patient Position: Sitting, Cuff Size: Large)   Pulse 86   Ht 6\' 2"  (1.88 m)   Wt (!) 311 lb 6.4 oz (141.3 kg)   BMI 39.98 kg/m   Constitutional:  Alert and oriented, No acute distress. HEENT: [place] AT, moist mucus membranes.  Trachea midline, no masses. Cardiovascular: No clubbing, cyanosis, or edema.  RRR Respiratory: Normal respiratory effort, no increased work of breathing.  Clear GI: Abdomen is soft, nontender, nondistended, no abdominal masses GU: No CVA tenderness Lymph: No cervical or inguinal lymphadenopathy. Skin: No rashes, bruises or suspicious lesions. Neurologic: Grossly intact, no focal deficits, moving all 4 extremities. Psychiatric: Normal mood and affect.   Assessment & Plan:    63 year-old male with an anterior urethral stricture presents for cystoscopy with stricture dilation.  The procedure has been discussed in detail as per my procedure note of this date.  Abbie Sons, [facility] [address] [place], [place] [address] [phone]

## 2018-12-06 ENCOUNTER — Other Ambulatory Visit: Payer: Self-pay

## 2018-12-06 DIAGNOSIS — Z01818 Encounter for other preprocedural examination: Secondary | ICD-10-CM

## 2018-12-07 ENCOUNTER — Other Ambulatory Visit: Payer: 59

## 2018-12-07 ENCOUNTER — Encounter
Admission: RE | Admit: 2018-12-07 | Discharge: 2018-12-07 | Disposition: A | Discharge status: Home / Self Care | Payer: 59 | Source: Ambulatory Visit | Attending: Urology | Admitting: Urology

## 2018-12-07 ENCOUNTER — Other Ambulatory Visit: Payer: Self-pay

## 2018-12-07 DIAGNOSIS — Z01818 Encounter for other preprocedural examination: Secondary | ICD-10-CM | POA: Diagnosis present

## 2018-12-07 DIAGNOSIS — Z20828 Contact with and (suspected) exposure to other viral communicable diseases: Secondary | ICD-10-CM | POA: Insufficient documentation

## 2018-12-07 DIAGNOSIS — Z01810 Encounter for preprocedural cardiovascular examination: Secondary | ICD-10-CM | POA: Diagnosis not present

## 2018-12-07 NOTE — Patient Instructions (Addendum)
  Your procedure is scheduled on: Wednesday 12/14/18.  Report to DAY SURGERY DEPARTMENT LOCATED ON 2ND FLOOR MEDICAL MALL ENTRANCE. To find out your arrival time please call [phone] between 1PM - 3PM on Tuesday 12/14/18.   Remember: Instructions that are not followed completely may result in serious medical risk, up to and including death, or upon the discretion of your surgeon and anesthesiologist your surgery may need to be rescheduled.       _X__ 1. Do not eat food after midnight the night before your procedure.                 No gum chewing or hard candies. You may drink clear liquids up to 2 hours                 before you are scheduled to arrive for your surgery- DO NOT drink clear                 liquids within 2 hours of the start of your surgery.                 Clear Liquids include:  water, apple juice without pulp, clear carbohydrate                 drink such as Clearfast or Gatorade, Black Coffee or Tea (Do not add                 milk or creamer to coffee or tea).    __X__2.  On the morning of surgery brush your teeth with toothpaste and water, you may rinse your mouth with mouthwash if you wish.  Do not swallow any toothpaste or mouthwash.      _X__ 3.  No Alcohol for 24 hours before or after surgery.    __X__6.  Notify your doctor if there is any change in your medical condition      (cold, fever, infections).       Do not wear jewelry, make-up, hairpins, clips or nail polish. Do not wear lotions, powders, or perfumes.  Do not shave 48 hours prior to surgery. Men may shave face and neck. Do not bring valuables to the hospital.     [facility] is not responsible for any belongings or valuables.   Contacts, dentures/partials or body piercings may not be worn into surgery. Bring a case for your contacts, glasses or hearing aids, a denture cup will be supplied.    Patients discharged the day of surgery will not be allowed to drive home.    __X__ Take  these medicines the morning of surgery with A SIP OF WATER:     1. famotidine (PEPCID) 20 MG tablet       __X__ Stop Anti-inflammatories 7 days before surgery such as Advil, Ibuprofen, Motrin, BC or Goodies Powder, Naprosyn, Naproxen, Aleve, Aspirin, Meloxicam. May take Tylenol if needed for pain or discomfort.    __X__ Please do not begin taking any new herbal supplements before your procedure.

## 2018-12-08 LAB — URINE CULTURE: Culture: NO GROWTH

## 2018-12-09 ENCOUNTER — Other Ambulatory Visit: Payer: Self-pay

## 2018-12-09 ENCOUNTER — Other Ambulatory Visit
Admission: RE | Admit: 2018-12-09 | Discharge: 2018-12-09 | Disposition: A | Discharge status: Home / Self Care | Payer: 59 | Source: Ambulatory Visit | Attending: Urology | Admitting: Urology

## 2018-12-09 ENCOUNTER — Other Ambulatory Visit: Payer: 59

## 2018-12-09 DIAGNOSIS — Z01818 Encounter for other preprocedural examination: Secondary | ICD-10-CM | POA: Diagnosis not present

## 2018-12-10 LAB — SARS CORONAVIRUS 2 (TAT 6-24 HRS): SARS Coronavirus 2: NEGATIVE

## 2018-12-13 ENCOUNTER — Other Ambulatory Visit: Payer: 59 | Admitting: Urology

## 2018-12-13 MED ORDER — CEFAZOLIN SODIUM-DEXTROSE 2-4 GM/100ML-% IV SOLN
2.0000 g | Freq: Once | INTRAVENOUS | Status: AC
  Administered 2018-12-14: 2 g via INTRAVENOUS

## 2018-12-14 ENCOUNTER — Ambulatory Visit
Admission: RE | Admit: 2018-12-14 | Discharge: 2018-12-14 | Disposition: A | Discharge status: Home / Self Care | Payer: 59 | Attending: Urology | Admitting: Urology

## 2018-12-14 ENCOUNTER — Ambulatory Visit: Payer: 59 | Admitting: Certified Registered Nurse Anesthetist

## 2018-12-14 ENCOUNTER — Encounter: Payer: Self-pay | Admitting: Certified Registered Nurse Anesthetist

## 2018-12-14 ENCOUNTER — Encounter
Admission: RE | Disposition: A | Discharge status: Home / Self Care | Payer: Self-pay | Source: Home / Self Care | Attending: Urology

## 2018-12-14 DIAGNOSIS — Z6839 Body mass index (BMI) 39.0-39.9, adult: Secondary | ICD-10-CM | POA: Insufficient documentation

## 2018-12-14 DIAGNOSIS — N200 Calculus of kidney: Secondary | ICD-10-CM | POA: Insufficient documentation

## 2018-12-14 DIAGNOSIS — N3080 Other cystitis without hematuria: Secondary | ICD-10-CM | POA: Insufficient documentation

## 2018-12-14 DIAGNOSIS — G629 Polyneuropathy, unspecified: Secondary | ICD-10-CM | POA: Diagnosis not present

## 2018-12-14 DIAGNOSIS — R319 Hematuria, unspecified: Secondary | ICD-10-CM | POA: Diagnosis not present

## 2018-12-14 DIAGNOSIS — Z79899 Other long term (current) drug therapy: Secondary | ICD-10-CM | POA: Diagnosis not present

## 2018-12-14 DIAGNOSIS — E669 Obesity, unspecified: Secondary | ICD-10-CM | POA: Diagnosis not present

## 2018-12-14 DIAGNOSIS — N35919 Unspecified urethral stricture, male, unspecified site: Secondary | ICD-10-CM | POA: Diagnosis not present

## 2018-12-14 DIAGNOSIS — N35914 Unspecified anterior urethral stricture, male: Secondary | ICD-10-CM | POA: Insufficient documentation

## 2018-12-14 DIAGNOSIS — Z792 Long term (current) use of antibiotics: Secondary | ICD-10-CM | POA: Insufficient documentation

## 2018-12-14 DIAGNOSIS — E7800 Pure hypercholesterolemia, unspecified: Secondary | ICD-10-CM | POA: Diagnosis not present

## 2018-12-14 DIAGNOSIS — Z87442 Personal history of urinary calculi: Secondary | ICD-10-CM | POA: Insufficient documentation

## 2018-12-14 DIAGNOSIS — R8281 Pyuria: Secondary | ICD-10-CM

## 2018-12-14 DIAGNOSIS — K219 Gastro-esophageal reflux disease without esophagitis: Secondary | ICD-10-CM | POA: Diagnosis not present

## 2018-12-14 DIAGNOSIS — N35814 Other anterior urethral stricture, male: Secondary | ICD-10-CM | POA: Diagnosis not present

## 2018-12-14 HISTORY — PX: CYSTOSCOPY WITH BIOPSY: SHX5122

## 2018-12-14 HISTORY — PX: CYSTOSCOPY WITH URETHRAL DILATATION: SHX5125

## 2018-12-14 SURGERY — CYSTOSCOPY, WITH URETHRAL DILATION
Anesthesia: General | Site: Urethra

## 2018-12-14 MED ORDER — OXYCODONE HCL 5 MG/5ML PO SOLN: 5.0000 mg | Freq: Once | ORAL | Status: DC | PRN

## 2018-12-14 MED ORDER — DEXAMETHASONE SODIUM PHOSPHATE 10 MG/ML IJ SOLN
INTRAMUSCULAR | Status: AC
  Filled 2018-12-14: qty 1

## 2018-12-14 MED ORDER — FENTANYL CITRATE (PF) 100 MCG/2ML IJ SOLN: 25.0000 ug | INTRAMUSCULAR | Status: DC | PRN

## 2018-12-14 MED ORDER — IOHEXOL 180 MG/ML  SOLN
INTRAMUSCULAR | Status: DC | PRN
  Administered 2018-12-14: 40 mL

## 2018-12-14 MED ORDER — HYDROCODONE-ACETAMINOPHEN 5-325 MG PO TABS: 1.0000 | ORAL_TABLET | ORAL | 0 refills | Status: DC | PRN

## 2018-12-14 MED ORDER — PROPOFOL 10 MG/ML IV BOLUS
INTRAVENOUS | Status: AC
  Filled 2018-12-14: qty 20

## 2018-12-14 MED ORDER — MIDAZOLAM HCL 2 MG/2ML IJ SOLN
INTRAMUSCULAR | Status: AC
  Filled 2018-12-14: qty 2

## 2018-12-14 MED ORDER — LIDOCAINE HCL (PF) 2 % IJ SOLN
INTRAMUSCULAR | Status: AC
  Filled 2018-12-14: qty 10

## 2018-12-14 MED ORDER — OXYCODONE HCL 5 MG PO TABS: 5.0000 mg | ORAL_TABLET | Freq: Once | ORAL | Status: DC | PRN

## 2018-12-14 MED ORDER — PROPOFOL 10 MG/ML IV BOLUS
INTRAVENOUS | Status: DC | PRN
  Administered 2018-12-14: 200 mg via INTRAVENOUS

## 2018-12-14 MED ORDER — FENTANYL CITRATE (PF) 100 MCG/2ML IJ SOLN
INTRAMUSCULAR | Status: DC | PRN
  Administered 2018-12-14 (×4): 25 ug via INTRAVENOUS

## 2018-12-14 MED ORDER — MIDAZOLAM HCL 2 MG/2ML IJ SOLN
INTRAMUSCULAR | Status: DC | PRN
  Administered 2018-12-14: 2 mg via INTRAVENOUS

## 2018-12-14 MED ORDER — ONDANSETRON HCL 4 MG/2ML IJ SOLN
INTRAMUSCULAR | Status: DC | PRN
  Administered 2018-12-14: 4 mg via INTRAVENOUS

## 2018-12-14 MED ORDER — LIDOCAINE HCL (CARDIAC) PF 100 MG/5ML IV SOSY
PREFILLED_SYRINGE | INTRAVENOUS | Status: DC | PRN
  Administered 2018-12-14: 100 mg via INTRAVENOUS

## 2018-12-14 MED ORDER — FENTANYL CITRATE (PF) 100 MCG/2ML IJ SOLN
INTRAMUSCULAR | Status: AC
  Filled 2018-12-14: qty 2

## 2018-12-14 MED ORDER — DEXAMETHASONE SODIUM PHOSPHATE 10 MG/ML IJ SOLN
INTRAMUSCULAR | Status: DC | PRN
  Administered 2018-12-14: 10 mg via INTRAVENOUS

## 2018-12-14 MED ORDER — ONDANSETRON HCL 4 MG/2ML IJ SOLN
INTRAMUSCULAR | Status: AC
  Filled 2018-12-14: qty 2

## 2018-12-14 MED ORDER — LACTATED RINGERS IV SOLN
INTRAVENOUS | Status: DC
  Administered 2018-12-14: 14:00:00 via INTRAVENOUS

## 2018-12-14 SURGICAL SUPPLY — 18 items
BAG URINE DRAINAGE (UROLOGICAL SUPPLIES) ×3 IMPLANT
CATH FOL 2WAY LX 16X5 (CATHETERS) IMPLANT
CATH FOL 2WAY LX 18X30 (CATHETERS) ×3 IMPLANT
CATH FOLEY 2W COUNCIL 20FR 5CC (CATHETERS) IMPLANT
CATH SET URETHRAL DILATOR (CATHETERS) ×3 IMPLANT
ELECT REM PT RETURN 9FT ADLT (ELECTROSURGICAL)
ELECTRODE REM PT RTRN 9FT ADLT (ELECTROSURGICAL) IMPLANT
GLOVE BIO SURGEON STRL SZ8 (GLOVE) ×3 IMPLANT
GOWN STRL REUS W/ TWL XL LVL3 (GOWN DISPOSABLE) ×4 IMPLANT
GOWN STRL REUS W/TWL XL LVL3 (GOWN DISPOSABLE) ×2
HOLDER FOLEY CATH W/STRAP (MISCELLANEOUS) IMPLANT
PACK CYSTO AR (MISCELLANEOUS) ×3 IMPLANT
SENSORWIRE 0.038 NOT ANGLED (WIRE) ×3
SET CYSTO W/LG BORE CLAMP LF (SET/KITS/TRAYS/PACK) ×3 IMPLANT
SYR 30ML LL (SYRINGE) ×3 IMPLANT
WATER STERILE IRR 1000ML POUR (IV SOLUTION) ×3 IMPLANT
WATER STERILE IRR 3000ML UROMA (IV SOLUTION) ×3 IMPLANT
WIRE SENSOR 0.038 NOT ANGLED (WIRE) ×2 IMPLANT

## 2018-12-14 NOTE — Anesthesia Post-op Follow-up Note (Signed)
Anesthesia QCDR form completed.        

## 2018-12-14 NOTE — Anesthesia Procedure Notes (Signed)
Procedure Name: LMA Insertion Date/Time: 12/14/2018 2:12 PM Performed by: Eben Burow, CRNA Pre-anesthesia Checklist: Patient identified, Emergency Drugs available, Suction available and Patient being monitored Patient Re-evaluated:Patient Re-evaluated prior to induction Oxygen Delivery Method: Circle system utilized Induction Type: IV induction Ventilation: Mask ventilation without difficulty LMA: LMA inserted LMA Size: 5.0 Number of attempts: 1 Placement Confirmation: positive ETCO2 and breath sounds checked- equal and bilateral Tube secured with: Tape Dental Injury: Teeth and Oropharynx as per pre-operative assessment

## 2018-12-14 NOTE — Interval H&P Note (Signed)
History and Physical Interval Note:  12/14/2018 1:41 PM  Jordan Gaines  has presented today for surgery, with the diagnosis of urethral stricture,hematuria.  The various methods of treatment have been discussed with the patient and family. After consideration of risks, benefits and other options for treatment, the patient has consented to  Procedure(s): CYSTOSCOPY WITH URETHRAL DILATATION (N/A) as a surgical intervention.  The patient's history has been reviewed, patient examined, no change in status, stable for surgery.  I have reviewed the patient's chart and labs.  Questions were answered to the patient's satisfaction.     [place]

## 2018-12-14 NOTE — Transfer of Care (Signed)
Immediate Anesthesia Transfer of Care Note  Patient: Jordan Gaines  Procedure(s) Performed: CYSTOSCOPY WITH URETHRAL DILATATION (N/A Urethra) CYSTOSCOPY WITH BIOPSY (N/A Bladder)  Patient Location: PACU  Anesthesia Type:General  Level of Consciousness: awake, alert , oriented and patient cooperative  Airway & Oxygen Therapy: Patient Spontanous Breathing and Patient connected to face mask oxygen  Post-op Assessment: Report given to RN and Post -op Vital signs reviewed and stable  Post vital signs: Reviewed and stable  Last Vitals:  Vitals Value Taken Time  BP 141/88 12/14/18 1502  Temp    Pulse 72 12/14/18 1503  Resp 13 12/14/18 1503  SpO2 98 % 12/14/18 1503  Vitals shown include unvalidated device data.  Last Pain:  Vitals:   12/14/18 1336  TempSrc: Oral  PainSc: 0-No pain         Complications: No apparent anesthesia complications

## 2018-12-14 NOTE — Anesthesia Preprocedure Evaluation (Signed)
Anesthesia Evaluation  Patient identified by MRN, date of birth, ID band Patient awake    Reviewed: Allergy & Precautions, H&P , NPO status , Patient's Chart, lab work & pertinent test results  Airway Mallampati: III  TM Distance: <3 FB Neck ROM: limited    Dental  (+) Chipped, Poor Dentition, Missing, Upper Dentures   Pulmonary neg pulmonary ROS, neg shortness of breath,           Cardiovascular Exercise Tolerance: Good (-) Past MI negative cardio ROS       Neuro/Psych negative neurological ROS  negative psych ROS   GI/Hepatic Neg liver ROS, GERD  Medicated and Controlled,  Endo/Other  negative endocrine ROS  Renal/GU Renal disease  negative genitourinary   Musculoskeletal   Abdominal   Peds  Hematology negative hematology ROS (+)   Anesthesia Other Findings Past Medical History: No date: GERD (gastroesophageal reflux disease) No date: Kidney stones  Past Surgical History: No date: BLADDER SURGERY No date: TONSILLECTOMY  BMI    Body Mass Index: 37.23 kg/m      Reproductive/Obstetrics negative OB ROS                             Anesthesia Physical  Anesthesia Plan  ASA: III  Anesthesia Plan: General LMA   Post-op Pain Management:    Induction: Intravenous  PONV Risk Score and Plan: Ondansetron, Dexamethasone and Midazolam  Airway Management Planned: LMA  Additional Equipment:   Intra-op Plan:   Post-operative Plan: Extubation in OR  Informed Consent: I have reviewed the patients History and Physical, chart, labs and discussed the procedure including the risks, benefits and alternatives for the proposed anesthesia with the patient or authorized representative who has indicated his/her understanding and acceptance.     Dental Advisory Given  Plan Discussed with: Anesthesiologist, CRNA and Surgeon  Anesthesia Plan Comments: (Patient consented for risks of  anesthesia including but not limited to:  - adverse reactions to medications - damage to teeth, lips or other oral mucosa - sore throat or hoarseness - Damage to heart, brain, lungs or loss of life  Patient voiced understanding.)        Anesthesia Quick Evaluation

## 2018-12-14 NOTE — Discharge Instructions (Signed)
AMBULATORY SURGERY  DISCHARGE INSTRUCTIONS   1) The drugs that you were given will stay in your system until tomorrow so for the next 24 hours you should not:  A) Drive an automobile B) Make any legal decisions C) Drink any alcoholic beverage   2) You may resume regular meals tomorrow.  Today it is better to start with liquids and gradually work up to solid foods.  You may eat anything you prefer, but it is better to start with liquids, then soup and crackers, and gradually work up to solid foods.   3) Please notify your doctor immediately if you have any unusual bleeding, trouble breathing, redness and pain at the surgery site, drainage, fever, or pain not relieved by medication.    4) Additional Instructions:  Cystoscopy patient instructions  Following a cystoscopy, a catheter (a flexible rubber tube) is sometimes left in place to empty the bladder. This may cause some discomfort or a feeling that you need to urinate. Your doctor determines the period of time that the catheter will be left in place. You may have bloody urine for two to three days (Call your doctor if the amount of bleeding increases or does not subside).  You may pass blood clots in your urine, especially if you had a biopsy. It is not unusual to pass small blood clots and have some bloody urine a couple of weeks after your cystoscopy. Again, call your doctor if the bleeding does not subside. You may have: Dysuria (painful urination) Frequency (urinating often) Urgency (strong desire to urinate)  These symptoms are common especially if medicine is instilled into the bladder or a ureteral stent is placed. Avoiding alcohol and caffeine, such as coffee, tea, and chocolate, may help relieve these symptoms. Drink plenty of water, unless otherwise instructed. Your doctor may also prescribe an antibiotic or other medicine to reduce these symptoms.  Cystoscopy results are available soon after the procedure; biopsy  results usually take two to four days. Your doctor will discuss the results of your exam with you. Before you go home, you will be given specific instructions for follow-up care. Special Instructions:  1  If you are going home with a catheter in place do not take a tub bath until removed by your doctor.  2  You may resume your normal activities.  3  Do not drive or operate machinery if you are taking narcotic pain medicine.  4  Be sure to keep all follow-up appointments with your doctor.  5 If needed a prescription for pain medication was sent to your pharmacy.  6 Call Your Doctor If: The catheter is not draining  You have severe pain  You are unable to urinate  You have a fever over 101  You have severe bleeding                Please contact your physician with any problems or Same Day Surgery at [phone], Monday through Friday 6 am to 4 pm, or [place] at [facility] number at [phone].

## 2018-12-14 NOTE — Op Note (Signed)
Preoperative diagnosis:  1. Anterior urethral stricture  Postoperative diagnosis:  1. Anterior urethral stricture  Procedure: 1. Cystoscopy with dilation urethral stricture 2. Bladder biopsies  Surgeon: Abbie Sons, MD  Anesthesia: General  Complications: None  Intraoperative findings:  - 2-3 anterior urethral strictures estimated 10-14 French  - TUR defect with open prostatic fossa  - Large right lateral wall diverticulum.  Mild mucosal erythema without tumor  EBL: Minimal  Specimens: Bladder biopsies  Indication: FRITZ LICEA is a 63 y.o. patient with chronic microhematuria and sterile pyuria.  History of prostate outlet procedure approximately 25 years ago-probable TURP.  CT urogram showed no upper tract abnormalities.  There were nonobstructing renal calculi and a large right bladder diverticulum.  Office cystoscopy was unable to be performed secondary to an anterior urethral stricture.  After reviewing the management options for treatment, he elected to proceed with the above surgical procedure(s). We have discussed the potential benefits and risks of the procedure, side effects of the proposed treatment, the likelihood of the patient achieving the goals of the procedure, and any potential problems that might occur during the procedure or recuperation. Informed consent has been obtained.  Description of procedure:  The patient was taken to the operating room and general anesthesia was induced.  The patient was placed in the dorsal lithotomy position, prepped and draped in the usual sterile fashion, and preoperative antibiotics were administered. A preoperative time-out was performed.   A 21 French cystoscope was lubricated and passed per urethra and advanced proximally.  There was approximately 10 French stricture in the anterior urethra and 2-3 additional strictures were seen proximally slightly larger caliber.  A 0.038 sensor wire was placed through the cystoscope and  advanced to the stricture and into the bladder without difficulty.  The stricture was dilated from 12-24 [place] without difficulty.  The cystoscope was then readvanced.  No bleeding was noted at the dilated stricture sites.  There was a large open prostatic fossa.  Panendoscopy showed a large bladder wall diverticulum with mild mucosal erythema.  No solid or papillary tumors were identified.  Cold cup biopsies were taken of the bladder diverticulum mucosa.  There was no evidence of perforation.  Biopsy sites were cauterized with the Bugbee electrode.  The bladder was emptied and the cystoscope was removed.  An 75 French Foley catheter was placed without difficulty.  Catheter was irrigated with return of pink-tinged effluent.  After anesthetic reversal patient was transported to the PACU in stable condition.  Plan: Catheter removal 12/15/2018  Abbie Sons, M.D.

## 2018-12-15 ENCOUNTER — Other Ambulatory Visit: Payer: Self-pay

## 2018-12-15 ENCOUNTER — Encounter: Payer: Self-pay | Admitting: Urology

## 2018-12-15 ENCOUNTER — Ambulatory Visit (INDEPENDENT_AMBULATORY_CARE_PROVIDER_SITE_OTHER): Payer: 59 | Admitting: Family Medicine

## 2018-12-15 DIAGNOSIS — R3129 Other microscopic hematuria: Secondary | ICD-10-CM

## 2018-12-15 NOTE — Progress Notes (Signed)
Catheter Removal  Patient is present today for a catheter removal.  36ml of water was drained from the balloon. A 18FR foley cath was removed from the bladder no complications were noted . Patient tolerated well.  Performed by: Elberta Leatherwood, CMA  Follow up/ Additional notes: As scheduled

## 2018-12-15 NOTE — Anesthesia Postprocedure Evaluation (Signed)
Anesthesia Post Note  Patient: Jordan Gaines  Procedure(s) Performed: CYSTOSCOPY WITH URETHRAL DILATATION (N/A Urethra) CYSTOSCOPY WITH BIOPSY (N/A Bladder)  Patient location during evaluation: PACU Anesthesia Type: General Level of consciousness: awake and alert Pain management: pain level controlled Vital Signs Assessment: post-procedure vital signs reviewed and stable Respiratory status: spontaneous breathing, nonlabored ventilation, respiratory function stable and patient connected to nasal cannula oxygen Cardiovascular status: blood pressure returned to baseline and stable Postop Assessment: no apparent nausea or vomiting Anesthetic complications: no     Last Vitals:  Vitals:   12/14/18 1545 12/14/18 1605  BP: (!) 141/80 134/74  Pulse: 70 62  Resp: 16 18  Temp: (!) 35.9 C   SpO2: 96% 95%    Last Pain:  Vitals:   12/14/18 1605  TempSrc:   PainSc: 0-No pain                 Precious Haws [place]

## 2018-12-16 LAB — SURGICAL PATHOLOGY

## 2018-12-20 ENCOUNTER — Telehealth: Payer: Self-pay

## 2018-12-20 NOTE — Telephone Encounter (Signed)
-----   Message from Benard Halsted sent at 12/20/2018  8:19 AM EDT ----- Post op app made when you call patient to let him know about results if you could give him his follow up app.   Thanks, Sharyn Lull ----- Message ----- From: Abbie Sons, MD Sent: 12/19/2018   2:32 PM EDT To: [facility]  Can you let patient know bladder biopsies were negative for cancer and only showed inflammation.  Needs a postop follow-up with me 4-6 weeks

## 2018-12-20 NOTE — Telephone Encounter (Signed)
Called pt no answer. Left detailed message for pt per DPR informing him of information below as well as appt date and time. Advised pt to call back for questions or concerns.

## 2018-12-21 ENCOUNTER — Other Ambulatory Visit: Payer: Self-pay

## 2018-12-21 ENCOUNTER — Encounter
Admission: RE | Admit: 2018-12-21 | Discharge: 2018-12-21 | Disposition: A | Discharge status: Home / Self Care | Payer: 59 | Source: Ambulatory Visit | Attending: Urology | Admitting: Urology

## 2018-12-21 DIAGNOSIS — R935 Abnormal findings on diagnostic imaging of other abdominal regions, including retroperitoneum: Secondary | ICD-10-CM | POA: Insufficient documentation

## 2018-12-21 DIAGNOSIS — N323 Diverticulum of bladder: Secondary | ICD-10-CM | POA: Diagnosis not present

## 2018-12-21 MED ORDER — TECHNETIUM TC 99M MEDRONATE IV KIT
20.0000 | PACK | Freq: Once | INTRAVENOUS | Status: AC | PRN
  Administered 2018-12-21: 24.485 via INTRAVENOUS

## 2018-12-22 ENCOUNTER — Ambulatory Visit (INDEPENDENT_AMBULATORY_CARE_PROVIDER_SITE_OTHER): Payer: 59 | Admitting: *Deleted

## 2018-12-22 ENCOUNTER — Other Ambulatory Visit: Payer: Self-pay

## 2018-12-22 DIAGNOSIS — R339 Retention of urine, unspecified: Secondary | ICD-10-CM

## 2018-12-22 DIAGNOSIS — R3129 Other microscopic hematuria: Secondary | ICD-10-CM | POA: Diagnosis not present

## 2018-12-22 LAB — BLADDER SCAN AMB NON-IMAGING: Scan Result: 0

## 2018-12-22 MED ORDER — TAMSULOSIN HCL 0.4 MG PO CAPS: 0.4000 mg | ORAL_CAPSULE | Freq: Every day | ORAL | 0 refills | Status: DC

## 2018-12-22 NOTE — Progress Notes (Signed)
Patient presented to office having incomplete emptying symptoms. Bladder scan showed 20ml. He states he was having some issues after the catheter removal on 12/15/2018 and he noticed some blood in his urine, which has subsided. Dr. Bernardo Heater advised to take Flomax to see if his symptoms have improved. Patient advised to let the office know if his symptoms don't improve. Follow up as scheduled.

## 2018-12-26 ENCOUNTER — Telehealth: Payer: Self-pay | Admitting: *Deleted

## 2018-12-26 NOTE — Telephone Encounter (Signed)
Notified patient as instructed, Left on VM

## 2018-12-26 NOTE — Telephone Encounter (Signed)
-----   Message from Abbie Sons, MD sent at 12/24/2018 12:04 PM EDT ----- Bone scan showed no abnormalities

## 2019-01-13 ENCOUNTER — Other Ambulatory Visit: Payer: Self-pay

## 2019-01-13 DIAGNOSIS — R339 Retention of urine, unspecified: Secondary | ICD-10-CM

## 2019-01-13 MED ORDER — TAMSULOSIN HCL 0.4 MG PO CAPS: 0.4000 mg | ORAL_CAPSULE | Freq: Every day | ORAL | 3 refills | Status: DC

## 2019-01-15 ENCOUNTER — Encounter: Payer: Self-pay | Admitting: Nurse Practitioner

## 2019-01-15 DIAGNOSIS — I700 Atherosclerosis of aorta: Secondary | ICD-10-CM | POA: Insufficient documentation

## 2019-01-17 ENCOUNTER — Encounter: Payer: Self-pay | Admitting: Nurse Practitioner

## 2019-01-17 ENCOUNTER — Other Ambulatory Visit: Payer: Self-pay

## 2019-01-17 ENCOUNTER — Ambulatory Visit (INDEPENDENT_AMBULATORY_CARE_PROVIDER_SITE_OTHER): Payer: 59 | Admitting: Nurse Practitioner

## 2019-01-17 VITALS — BP 126/89 | HR 67 | Temp 97.9°F

## 2019-01-17 DIAGNOSIS — E7800 Pure hypercholesterolemia, unspecified: Secondary | ICD-10-CM

## 2019-01-17 DIAGNOSIS — R3129 Other microscopic hematuria: Secondary | ICD-10-CM | POA: Diagnosis not present

## 2019-01-17 DIAGNOSIS — R21 Rash and other nonspecific skin eruption: Secondary | ICD-10-CM | POA: Insufficient documentation

## 2019-01-17 DIAGNOSIS — I700 Atherosclerosis of aorta: Secondary | ICD-10-CM | POA: Diagnosis not present

## 2019-01-17 DIAGNOSIS — G8929 Other chronic pain: Secondary | ICD-10-CM | POA: Insufficient documentation

## 2019-01-17 DIAGNOSIS — M546 Pain in thoracic spine: Secondary | ICD-10-CM

## 2019-01-17 MED ORDER — TIZANIDINE HCL 2 MG PO CAPS: 2.0000 mg | ORAL_CAPSULE | Freq: Two times a day (BID) | ORAL | 0 refills | Status: DC | PRN

## 2019-01-17 MED ORDER — ATORVASTATIN CALCIUM 10 MG PO TABS: 10.0000 mg | ORAL_TABLET | Freq: Every day | ORAL | 5 refills | Status: DC

## 2019-01-17 NOTE — Patient Instructions (Signed)

## 2019-01-17 NOTE — Assessment & Plan Note (Addendum)
Noted on August 2020 CT, recommend continued use of statin and monitor for control of cholesterol, may add on daily ASA in future once no further issues with hematuria.

## 2019-01-17 NOTE — Progress Notes (Signed)
BP 126/89   Pulse 67   Temp 97.9 F (36.6 C) (Oral)   SpO2 95%    Subjective:    Patient ID: Jordan Gaines, male    DOB: 07-Jul-1955, 63 y.o.   MRN: [id number]  HPI: Jordan Gaines is a 63 y.o. male  Chief Complaint  Patient presents with  . Hyperlipidemia   HYPERLIPIDEMIA Had stopped taking Lipitor as he thought since cholesterol levels had improved that he did not need to take, discussed with him need to continued treatment and reviewed labs.  Did note aortic atherosclerosis on recent CT scan in August, recommended continued use of statin and educated. Hyperlipidemia status: good compliance Satisfied with current treatment?  yes Side effects:  no Medication compliance: good compliance Past cholesterol meds: Lipitor Supplements: none Aspirin:  no The ASCVD Risk score Mikey Bussing DC Jr., et al., 2013) failed to calculate for the following reasons:   The valid total cholesterol range is 130 to 320 mg/dL Chest pain:  no Coronary artery disease:  no Family history CAD:  no Family history early CAD:  no   HEMATURIA: Followed by urology with recent biopsy and cystoscopy.  Also had recent bone scan due to initial abnormalities on CT, this returned negative.  He was diagnosed with anterior urethral stricture and had surgery 12/14/18.    RASH Has a rash to around next that flares, wears a necklace on weekends and notices often it flares up more with this.  Has used hydrocortisone, reports this has helped.  Rash not present today. Duration:  months  Location: neck  Itching: yes Burning: no Redness: yes Oozing: no Scaling: no Blisters: no Painful: no Fevers: no Change in detergents/soaps/personal care products: no Recent illness: no Recent travel:no History of same: yes Context: stable Alleviating factors: hydrocortisone cream Treatments attempted:hydrocortisone cream Shortness of breath: no  Throat/tongue swelling: no Myalgias/arthralgias: no   BACK PAIN Does heavy  lifting at job, gets occasional mid to lower back discomfort bilaterally + occasional "catch" in the right side.  Has been present for several months on and off.  Present more in evenings, after work, but not constant. Duration: weeks Mechanism of injury: lifting Location: midline Onset: gradual Severity: 4/10 Quality: dull and aching Frequency: intermittent Radiation: none Aggravating factors: lifting, movement and bending Alleviating factors: NSAIDs Status: stable Treatments attempted: ibuprofen  Relief with NSAIDs?: moderate Nighttime pain:  no Paresthesias / decreased sensation:  no Bowel / bladder incontinence:  no Fevers:  no Dysuria / urinary frequency:  no  Relevant past medical, surgical, family and social history reviewed and updated as indicated. Interim medical history since our last visit reviewed. Allergies and medications reviewed and updated.  Review of Systems  Constitutional: Negative for activity change, diaphoresis, fatigue and fever.  Respiratory: Negative for cough, chest tightness, shortness of breath and wheezing.   Cardiovascular: Negative for chest pain, palpitations and leg swelling.  Gastrointestinal: Negative for abdominal distention, abdominal pain, constipation, diarrhea, nausea and vomiting.  Musculoskeletal: Positive for back pain.  Skin: Positive for rash.  Neurological: Negative for dizziness, syncope, weakness, light-headedness, numbness and headaches.  Psychiatric/Behavioral: Negative.     Per HPI unless specifically indicated above     Objective:    BP 126/89   Pulse 67   Temp 97.9 F (36.6 C) (Oral)   SpO2 95%   Wt Readings from Last 3 Encounters:  12/14/18 (!) 308 lb 10.3 oz (140 kg)  12/07/18 (!) 308 lb 11.2 oz (140 kg)  12/01/18 Marland Kitchen)  311 lb 6.4 oz (141.3 kg)    Physical Exam Vitals signs and nursing note reviewed.  Constitutional:      General: He is awake. He is not in acute distress.    Appearance: He is well-developed.  He is obese. He is not ill-appearing.  HENT:     Head: Normocephalic and atraumatic.     Right Ear: Hearing normal. No drainage.     Left Ear: Hearing normal. No drainage.  Eyes:     General: Lids are normal.        Right eye: No discharge.        Left eye: No discharge.     Conjunctiva/sclera: Conjunctivae normal.     Pupils: Pupils are equal, round, and reactive to light.  Neck:     Musculoskeletal: Normal range of motion and neck supple.     Thyroid: No thyromegaly.     Vascular: No carotid bruit.  Cardiovascular:     Rate and Rhythm: Normal rate and regular rhythm.     Heart sounds: Normal heart sounds, S1 normal and S2 normal. No murmur. No gallop.   Pulmonary:     Effort: Pulmonary effort is normal. No accessory muscle usage or respiratory distress.     Breath sounds: Normal breath sounds.  Abdominal:     General: Bowel sounds are normal.     Palpations: Abdomen is soft.  Musculoskeletal: Normal range of motion.     Thoracic back: He exhibits normal range of motion, no tenderness, no swelling, no edema, no laceration, no pain and no spasm.     Right lower leg: No edema.     Left lower leg: No edema.     Comments: No rashes noted to back and normal ROM today.  Unable to reproduce discomfort.    Skin:    General: Skin is warm and dry.     Capillary Refill: Capillary refill takes less than 2 seconds.     Findings: No rash.     Comments: No rash present to neck line on exam today.  Neurological:     Mental Status: He is alert and oriented to person, place, and time.     Deep Tendon Reflexes: Reflexes are normal and symmetric.  Psychiatric:        Mood and Affect: Mood normal.        Behavior: Behavior normal. Behavior is cooperative.        Thought Content: Thought content normal.        Judgment: Judgment normal.     Results for orders placed or performed in visit on 12/22/18  Bladder Scan (Post Void Residual) in office  Result Value Ref Range   Scan Result 0        Assessment & Plan:   Problem List Items Addressed This Visit      Cardiovascular and Mediastinum   Aortic atherosclerosis ([place]) - Primary    Noted on August 2020 CT, recommend continued use of statin and monitor for control of cholesterol, may add on daily ASA in future once no further issues with hematuria.      Relevant Medications   atorvastatin (LIPITOR) 10 MG tablet     Musculoskeletal and Integument   Rash    Not present on exam today.  Recommend not wearing necklace and monitoring for rash without it.  If ongoing issues return to clinic for further assessment.          Genitourinary   Microscopic hematuria  Followed by urology, with recent surgical procedure and improvement.  Continue to collaborate with urology.        Other   Pure hypercholesterolemia    Ongoing, recommend continued use of statin.  Refills sent, he agrees with this plan.  Educated him on benefit, bu reviewing recent labs with him.  Plan on labs next visit.      Relevant Medications   atorvastatin (LIPITOR) 10 MG tablet   Chronic bilateral thoracic back pain    Ongoing, no worsening. Refuses imaging today.  Suspect musculoskeletal due to frequent lifting at job. Scripts for PRN Zanaflex sent, educated on use, to use minimally and not while working or driving.  Return to office for worsening or continued issues.  If ongoing will obtain imaging.      Relevant Medications   tizanidine (ZANAFLEX) 2 MG capsule       Follow up plan: Return in about 6 months (around 07/18/2019) for HLD.

## 2019-01-17 NOTE — Assessment & Plan Note (Signed)
Not present on exam today.  Recommend not wearing necklace and monitoring for rash without it.  If ongoing issues return to clinic for further assessment.

## 2019-01-17 NOTE — Assessment & Plan Note (Signed)
Ongoing, no worsening. Refuses imaging today.  Suspect musculoskeletal due to frequent lifting at job. Scripts for PRN Zanaflex sent, educated on use, to use minimally and not while working or driving.  Return to office for worsening or continued issues.  If ongoing will obtain imaging.

## 2019-01-17 NOTE — Assessment & Plan Note (Signed)
Followed by urology, with recent surgical procedure and improvement.  Continue to collaborate with urology.

## 2019-01-17 NOTE — Assessment & Plan Note (Signed)
Ongoing, recommend continued use of statin.  Refills sent, he agrees with this plan.  Educated him on benefit, bu reviewing recent labs with him.  Plan on labs next visit.

## 2019-01-24 ENCOUNTER — Other Ambulatory Visit: Payer: Self-pay | Admitting: Nurse Practitioner

## 2019-01-24 NOTE — Telephone Encounter (Signed)
Requested medication (s) are due for refill today: yes  Requested medication (s) are on the active medication list: yes  Last refill:  01/17/2019  Future visit scheduled: yes  Notes to clinic:  Refill cannot be delegated    Requested Prescriptions  Pending Prescriptions Disp Refills   tizanidine (ZANAFLEX) 2 MG capsule [Pharmacy Med Name: TIZANIDINE HCL 2 MG CAPSULE] 30 capsule 0    Sig: Take 1 capsule (2 mg total) by mouth 2 (two) times daily as needed for muscle spasms.     Not Delegated - Cardiovascular:  Alpha-2 Agonists - tizanidine Failed - 01/24/2019  8:31 AM      Failed - This refill cannot be delegated      Passed - Valid encounter within last 6 months    Recent Outpatient Visits          1 week ago Aortic atherosclerosis ([place])   [place], Lane T, NP   3 months ago Asymptomatic microscopic hematuria   [place], Barbaraann Faster, NP   4 months ago Encounter for annual physical exam   [place], Henrine Screws T, NP   4 months ago Neuropathy   [place], Barbaraann Faster, NP      Future Appointments            In 3 days Stoioff, Ronda Fairly, MD [place]   In 1 month [name], MD [place]   In 5 months [place], Barbaraann Faster, NP [facility], [facility]

## 2019-01-25 ENCOUNTER — Ambulatory Visit: Payer: 59 | Admitting: Urology

## 2019-01-27 ENCOUNTER — Other Ambulatory Visit: Payer: Self-pay

## 2019-01-27 ENCOUNTER — Ambulatory Visit (INDEPENDENT_AMBULATORY_CARE_PROVIDER_SITE_OTHER): Payer: 59 | Admitting: Urology

## 2019-01-27 ENCOUNTER — Encounter: Payer: Self-pay | Admitting: Urology

## 2019-01-27 VITALS — BP 130/80 | HR 81 | Ht 74.0 in | Wt 301.0 lb

## 2019-01-27 DIAGNOSIS — N35814 Other anterior urethral stricture, male: Secondary | ICD-10-CM | POA: Diagnosis not present

## 2019-01-27 NOTE — Progress Notes (Signed)
01/27/2019 1:24 PM   Jordan Gaines [birth year] [id number]  Referring provider: Venita Lick, NP [address] [place],  [place] [address]  Chief Complaint  Patient presents with  . Routine Post Op    HPI: 63 y.o. male presents for postop follow-up.  He underwent cystoscopy under anesthesia on 12/14/2018 with dilation of 2-3 anterior urethral strictures with an estimated size of 10-14 [place].  He had a TUR defect with an open prostatic fossa.  There was a large right lateral wall bladder diverticulum with mild mucosal erythema.  Biopsies were performed which showed only inflammatory changes.  He states he is voiding with an excellent stream and has no complaints.  He remains on tamsulosin.  He also had a bony abnormality on CT urogram for which a bone scan was recommended.  The bone scan showed no abnormal uptake and was consistent with a benign process.  PMH: Past Medical History:  Diagnosis Date  . GERD (gastroesophageal reflux disease)   . Kidney stones     Surgical History: Past Surgical History:  Procedure Laterality Date  . BLADDER SURGERY    . COLONOSCOPY WITH PROPOFOL N/A 10/28/2018   Procedure: COLONOSCOPY WITH PROPOFOL;  Surgeon: [name], MD;  Location: [facility] ENDOSCOPY;  Service: Gastroenterology;  Laterality: N/A;  . CYSTOSCOPY WITH BIOPSY N/A 12/14/2018   Procedure: CYSTOSCOPY WITH BIOPSY;  Surgeon: Abbie Sons, MD;  Location: [facility] ORS;  Service: Urology;  Laterality: N/A;  . CYSTOSCOPY WITH URETHRAL DILATATION N/A 12/14/2018   Procedure: CYSTOSCOPY WITH URETHRAL DILATATION;  Surgeon: Abbie Sons, MD;  Location: [facility] ORS;  Service: Urology;  Laterality: N/A;  . TONSILLECTOMY      Home Medications:  Allergies as of 01/27/2019   No Known Allergies     Medication List       Accurate as of January 27, 2019  1:24 PM. If you have any questions, ask your nurse or doctor.        atorvastatin 10 MG tablet Commonly known as: LIPITOR Take 1 tablet (10 mg  total) by mouth daily at 6 PM.   famotidine 20 MG tablet Commonly known as: Pepcid Take 1 tablet (20 mg total) by mouth 2 (two) times daily. What changed:   when to take this  additional instructions   nystatin cream Commonly known as: MYCOSTATIN Apply 1 application topically 2 (two) times daily. What changed:   when to take this  reasons to take this   tamsulosin 0.4 MG Caps capsule Commonly known as: FLOMAX Take 1 capsule (0.4 mg total) by mouth daily.   tizanidine 2 MG capsule Commonly known as: ZANAFLEX TAKE 1 CAPSULE (2 MG TOTAL) BY MOUTH 2 (TWO) TIMES DAILY AS NEEDED FOR MUSCLE SPASMS.   vitamin B-12 500 MCG tablet Commonly known as: CYANOCOBALAMIN Take 500 mcg by mouth daily.       Allergies: No Known Allergies  Family History: Family History  Problem Relation Age of Onset  . Diabetes Mother   . Heart disease Mother   . Diabetes Father   . Multiple sclerosis Son   . Heart disease Maternal Grandfather     Social History:  reports that he has never smoked. He has never used smokeless tobacco. He reports current alcohol use. He reports that he does not use drugs.  ROS: UROLOGY Frequent Urination?: No Hard to postpone urination?: No Burning/pain with urination?: No Get up at night to urinate?: No Leakage of urine?: No Urine stream starts and stops?: No  Trouble starting stream?: No Do you have to strain to urinate?: No Blood in urine?: No Urinary tract infection?: No Sexually transmitted disease?: No Injury to kidneys or bladder?: No Painful intercourse?: No Weak stream?: No Erection problems?: No Penile pain?: No  Gastrointestinal Nausea?: No Vomiting?: No Indigestion/heartburn?: No Diarrhea?: No Constipation?: No  Constitutional Fever: No Night sweats?: No Weight loss?: No Fatigue?: No  Skin Skin rash/lesions?: No Itching?: No  Eyes Blurred vision?: No Double vision?: No  Ears/Nose/Throat Sore throat?: No Sinus problems?:  No  Hematologic/Lymphatic Swollen glands?: No Easy bruising?: No  Cardiovascular Leg swelling?: No Chest pain?: No  Respiratory Cough?: No Shortness of breath?: No  Endocrine Excessive thirst?: No  Musculoskeletal Back pain?: No Joint pain?: No  Neurological Headaches?: No Dizziness?: No  Psychologic Depression?: No Anxiety?: No  Physical Exam: BP 130/80 (BP Location: Left Arm, Patient Position: Sitting, Cuff Size: Large)   Pulse 81   Ht 6\' 2"  (1.88 m)   Wt (!) 301 lb (136.5 kg)   BMI 38.65 kg/m   Constitutional:  Alert and oriented, No acute distress. HEENT: [place] AT, moist mucus membranes.  Trachea midline, no masses. Cardiovascular: No clubbing, cyanosis, or edema. Respiratory: Normal respiratory effort, no increased work of breathing.   Assessment & Plan:    - Anterior urethral strictures Doing well status post cystoscopy with stricture dilation.  He had an open prostatic fossa and his voiding symptoms were most likely due to the stricture.  He was informed he could discontinue the tamsulosin and restart if his voiding symptoms worsen.  We discussed the possibility of stricture recurrence and to return for worsening voiding symptoms.  He will otherwise follow-up in 6 months for IPSS and bladder scan for PVR.   Abbie Sons, [place] [address] [place], [place] [address] [phone]

## 2019-02-06 ENCOUNTER — Other Ambulatory Visit: Payer: Self-pay | Admitting: Nurse Practitioner

## 2019-02-06 NOTE — Telephone Encounter (Signed)
Requested medication (s) are due for refill today: yes  Requested medication (s) are on the active medication list: yes  Last refill:  01/28/2019  Future visit scheduled: yes  Notes to clinic:  Refill cannot be delegated    Requested Prescriptions  Pending Prescriptions Disp Refills   tizanidine (ZANAFLEX) 2 MG capsule [Pharmacy Med Name: TIZANIDINE HCL 2 MG CAPSULE] 30 capsule 0    Sig: TAKE 1 CAPSULE (2 MG TOTAL) BY MOUTH 2 (TWO) TIMES DAILY AS NEEDED FOR MUSCLE SPASMS.     Not Delegated - Cardiovascular:  Alpha-2 Agonists - tizanidine Failed - 02/06/2019 12:33 PM      Failed - This refill cannot be delegated      Passed - Valid encounter within last 6 months    Recent Outpatient Visits          2 weeks ago Aortic atherosclerosis ([place])   [place], [place] T, NP   3 months ago Asymptomatic microscopic hematuria   [place], Barbaraann Faster, NP   4 months ago Encounter for annual physical exam   [place], [employer] T, NP   4 months ago Neuropathy   [place], Barbaraann Faster, NP      Future Appointments            In 1 month [name], MD [place]   In 5 months [place], Barbaraann Faster, NP [facility], [place]   In 5 months [place], Ronda Fairly, MD [facility]

## 2019-02-22 ENCOUNTER — Other Ambulatory Visit: Payer: Self-pay | Admitting: Nurse Practitioner

## 2019-02-22 NOTE — Telephone Encounter (Signed)
Routing to provider  

## 2019-02-22 NOTE — Telephone Encounter (Signed)
Requested medication (s) are due for refill today: yes  Requested medication (s) are on the active medication list: yes  Last refill:  02/08/2019  Future visit scheduled: yes  Notes to clinic:  Refill cannot be delegated    Requested Prescriptions  Pending Prescriptions Disp Refills   tizanidine (ZANAFLEX) 2 MG capsule [Pharmacy Med Name: TIZANIDINE HCL 2 MG CAPSULE] 30 capsule 0    Sig: TAKE 1 CAPSULE (2 MG TOTAL) BY MOUTH 2 (TWO) TIMES DAILY AS NEEDED FOR MUSCLE SPASMS.     Not Delegated - Cardiovascular:  Alpha-2 Agonists - tizanidine Failed - 02/22/2019  2:32 PM      Failed - This refill cannot be delegated      Passed - Valid encounter within last 6 months    Recent Outpatient Visits          1 month ago Aortic atherosclerosis ([place])   [place], [place] T, NP   4 months ago Asymptomatic microscopic hematuria   [place], Barbaraann Faster, NP   5 months ago Encounter for annual physical exam   [place], Henrine Screws T, NP   5 months ago Neuropathy   [place], Barbaraann Faster, NP      Future Appointments            In 4 weeks [name], MD [place]   In 4 months [place], Barbaraann Faster, NP [facility], [place]   In 5 months Stoioff, Ronda Fairly, MD [facility]

## 2019-03-13 ENCOUNTER — Other Ambulatory Visit: Payer: Self-pay

## 2019-03-13 ENCOUNTER — Encounter: Payer: Self-pay | Admitting: Nurse Practitioner

## 2019-03-13 ENCOUNTER — Ambulatory Visit (INDEPENDENT_AMBULATORY_CARE_PROVIDER_SITE_OTHER): Payer: 59 | Admitting: Nurse Practitioner

## 2019-03-13 DIAGNOSIS — R05 Cough: Secondary | ICD-10-CM

## 2019-03-13 DIAGNOSIS — R059 Cough, unspecified: Secondary | ICD-10-CM | POA: Insufficient documentation

## 2019-03-13 MED ORDER — HYDROCOD POLST-CPM POLST ER 10-8 MG/5ML PO SUER: 5.0000 mL | Freq: Two times a day (BID) | ORAL | 0 refills | Status: DC | PRN

## 2019-03-13 MED ORDER — BENZONATATE 100 MG PO CAPS: 100.0000 mg | ORAL_CAPSULE | Freq: Three times a day (TID) | ORAL | 0 refills | Status: DC | PRN

## 2019-03-13 MED ORDER — ALBUTEROL SULFATE HFA 108 (90 BASE) MCG/ACT IN AERS: 2.0000 | INHALATION_SPRAY | Freq: Four times a day (QID) | RESPIRATORY_TRACT | 0 refills | Status: DC | PRN

## 2019-03-13 NOTE — Progress Notes (Signed)
There were no vitals taken for this visit.   Subjective:    Patient ID: Jordan Gaines, male    DOB: 06/22/1955, 63 y.o.   MRN: [id number]  HPI: Jordan Gaines is a 63 y.o. male  Chief Complaint  Patient presents with  . URI    pt states he has had a cough, congestion, and body aches since yesterday    . This visit was completed via Doximity due to the restrictions of the COVID-19 pandemic. All issues as above were discussed and addressed. Physical exam was done as above through visual confirmation on Doximity. If it was felt that the patient should be evaluated in the office, they were directed there. The patient verbally consented to this visit. . Location of the patient: home . Location of the provider: work . Those involved with this call:  . Provider: Marnee Guarneri, DNP . CMA: Lesle Chris, [place] . Front Desk/Registration: Jill Side  . Time spent on call: 15 minutes with patient face to face via video conference. More than 50% of this time was spent in counseling and coordination of care. 10 minutes total spent in review of patient's record and preparation of their chart.  . I verified patient identity using two factors (patient name and date of birth). Patient consents verbally to being seen via telemedicine visit today.    UPPER RESPIRATORY TRACT INFECTION Started coughing yesterday, took temperature last night was 99.6.  Has been taking Ibuprofen, which he reports is helping.  Works in [employer], has been working with other people.  They are wearing masks at work.  Denies SOB, loss of taste or smell.  Reports no known exposure to Covid positive patient. Fever: no Cough: yes Shortness of breath: no Wheezing: occasional Chest pain: no Chest tightness: no Chest congestion: yes Nasal congestion: yes Runny nose: no Post nasal drip: no Sneezing: no Sore throat: yes Swollen glands: no Sinus pressure: no Headache: no Face pain: no Toothache: no Ear pain: none Ear  pressure: none Eyes red/itching:no Eye drainage/crusting: no  Vomiting: no Rash: no Fatigue: yes Sick contacts: no Strep contacts: no  Context: stable Recurrent sinusitis: no Relief with OTC cold/cough medications: yes, some relief but coughed a lot last night  Treatments attempted: cold/sinus, mucinex and cough syrup   Relevant past medical, surgical, family and social history reviewed and updated as indicated. Interim medical history since our last visit reviewed. Allergies and medications reviewed and updated.  Review of Systems  Constitutional: Positive for fatigue. Negative for activity change, chills, diaphoresis and fever.  HENT: Positive for congestion and sore throat. Negative for ear discharge, ear pain, postnasal drip, rhinorrhea, sinus pressure, sinus pain and sneezing.   Respiratory: Positive for cough and wheezing. Negative for chest tightness and shortness of breath.   Cardiovascular: Negative for chest pain, palpitations and leg swelling.  Gastrointestinal: Negative for abdominal distention, abdominal pain, constipation, diarrhea, nausea and vomiting.  Musculoskeletal: Negative for myalgias.  Skin: Negative.   Neurological: Negative for dizziness, weakness, light-headedness, numbness and headaches.  Psychiatric/Behavioral: Negative.     Per HPI unless specifically indicated above     Objective:    There were no vitals taken for this visit.  Wt Readings from Last 3 Encounters:  01/27/19 (!) 301 lb (136.5 kg)  12/14/18 (!) 308 lb 10.3 oz (140 kg)  12/07/18 (!) 308 lb 11.2 oz (140 kg)    Physical Exam Vitals signs and nursing note reviewed.  Constitutional:      General:  He is awake. He is not in acute distress.    Appearance: He is well-developed. He is obese. He is ill-appearing.  HENT:     Head: Normocephalic.     Right Ear: Hearing normal. No drainage.     Left Ear: Hearing normal. No drainage.  Eyes:     General: Lids are normal.        Right eye:  No discharge.        Left eye: No discharge.     Conjunctiva/sclera: Conjunctivae normal.  Neck:     Musculoskeletal: Normal range of motion.  Pulmonary:     Effort: Pulmonary effort is normal. No accessory muscle usage or respiratory distress.  Chest:     Comments: Unable to auscultate due to virtual exam only.  Intermittent non productive cough noted during exam.  Able to talk without SOB noted and no audible wheezing. Neurological:     Mental Status: He is alert and oriented to person, place, and time.  Psychiatric:        Mood and Affect: Mood normal.        Behavior: Behavior normal. Behavior is cooperative.        Thought Content: Thought content normal.        Judgment: Judgment normal.     Results for orders placed or performed in visit on 12/22/18  Bladder Scan (Post Void Residual) in office  Result Value Ref Range   Scan Result 0       Assessment & Plan:   Problem List Items Addressed This Visit      Other   Cough - Primary    Suspect viral URI, due to current Covid pandemic and rising numbers + current symptoms have recommended Covid testing and this was ordered. Patient plans to obtain today and is aware to remain on self quarantine until test results return.  At this time will send scripts for Albuterol, Tessalon, and Tussionex.  Recommend continued OTC medications at home as needed + increase rest and hydration.  If Covid positive he is aware of need to self quarantine x 10 days.  Will follow-up after test results return.  Return to office for worsening or continued symptoms.      Relevant Orders   Novel Coronavirus, NAA ([facility])      I discussed the assessment and treatment plan with the patient. The patient was provided an opportunity to ask questions and all were answered. The patient agreed with the plan and demonstrated an understanding of the instructions.   The patient was advised to call back or seek an in-person evaluation if the symptoms worsen or if  the condition fails to improve as anticipated.   I provided 15 minutes of time during this encounter.  Follow up plan: Return if symptoms worsen or fail to improve.

## 2019-03-13 NOTE — Assessment & Plan Note (Signed)
Suspect viral URI, due to current Covid pandemic and rising numbers + current symptoms have recommended Covid testing and this was ordered. Patient plans to obtain today and is aware to remain on self quarantine until test results return.  At this time will send scripts for Albuterol, Tessalon, and Tussionex.  Recommend continued OTC medications at home as needed + increase rest and hydration.  If Covid positive he is aware of need to self quarantine x 10 days.  Will follow-up after test results return.  Return to office for worsening or continued symptoms.

## 2019-03-13 NOTE — Patient Instructions (Signed)

## 2019-03-14 DIAGNOSIS — Z20828 Contact with and (suspected) exposure to other viral communicable diseases: Secondary | ICD-10-CM | POA: Diagnosis not present

## 2019-03-17 ENCOUNTER — Telehealth: Payer: Self-pay | Admitting: Nurse Practitioner

## 2019-03-17 NOTE — Telephone Encounter (Signed)
Pt called back to inquire if note was ready.  I read him Jordan Gaines message.  Pt verbalized understanding. Pt asked me how early he could pick up note.  I advised pt to call back Monday am with an update, and I will send message to the doctor. Pt agreed. (As pt hung up the phone, pt coughed heavily)

## 2019-03-17 NOTE — Telephone Encounter (Signed)
Copied from [place] [id number]. Topic: General - Inquiry >> Mar 17, 2019  9:59 AM Scherrie Gerlach wrote: Reason for CRM: pt would like a note to return to work on [facility] 12/07.  [facility]. Pt had virtual with Jolene on 03/13/19.  Pt would like to come by office to pick this up.  However, pt is still having a little cough and runny nose.

## 2019-03-17 NOTE — Telephone Encounter (Signed)
Please see below.

## 2019-03-17 NOTE — Telephone Encounter (Signed)
Noted, will await check in call on Monday.

## 2019-03-20 ENCOUNTER — Encounter: Payer: Self-pay | Admitting: Nurse Practitioner

## 2019-03-20 ENCOUNTER — Ambulatory Visit: Payer: 59 | Admitting: Gastroenterology

## 2019-03-20 ENCOUNTER — Other Ambulatory Visit: Payer: Self-pay | Admitting: Nurse Practitioner

## 2019-03-20 MED ORDER — HYDROCOD POLST-CPM POLST ER 10-8 MG/5ML PO SUER: 5.0000 mL | Freq: Two times a day (BID) | ORAL | 0 refills | Status: DC | PRN

## 2019-03-20 NOTE — Telephone Encounter (Signed)
Routing to provider  

## 2019-03-20 NOTE — Telephone Encounter (Signed)
Pt has sent test results from [place] mychart.  Pt states call or message him when note is ready and he will pick up.

## 2019-03-20 NOTE — Telephone Encounter (Signed)
Patient notified and verbalized understanding. Provided the patient our fax number to have results sent to. Patient wants to know if he can have another RX for the cough syrup sent in.

## 2019-03-20 NOTE — Telephone Encounter (Signed)
Pt called this am.  However.pt states he is still coughing a little and has a little rattling in his chest.  Pt states he feels like he will be able to return to work tomorrow, as it has been 7 days today. Pt states he would like the work note to state he Was out of work awaiting covid results and getting over the URI. Pt would like 2 copies. As he works 2 jobs ans needs one for each.   Also took his last dose of chlorpheniramine-HYDROcodone (TUSSIONEX PENNKINETIC ER) 10-8 MG/5ML SUER And hopes he can get a refill.  Pt states this helped a lot.  [id number] [place], [place] - [address] [phone] (Phone) [phone] (Fax)

## 2019-03-20 NOTE — Telephone Encounter (Signed)
Pt states that he got his letter but it needs to have the exact dates on there.  Can the letter be revised with the dates November 30th - December 7th, returning to work on December 8th.

## 2019-03-20 NOTE — Telephone Encounter (Signed)
Patient notified of medication being sent in and letter.

## 2019-03-20 NOTE — Telephone Encounter (Signed)
Completed and sent via MyChart

## 2019-03-20 NOTE — Telephone Encounter (Signed)
Sent rx in.  Once we receive fax let me know and then I can write return to work note.

## 2019-03-20 NOTE — Telephone Encounter (Signed)
See MyChart message from the patient

## 2019-03-20 NOTE — Telephone Encounter (Signed)
Please alert him that since I do not have [facility] I will need a copy of his negative results sent to me.  Due to current continued cough we would not let in office at this time, does he have someone who could bring this to me.  I do recommend resting today and then if he has someone who can bring me that test result I can then write work note for tomorrow.  Thank you.

## 2019-03-20 NOTE — Telephone Encounter (Signed)
Completed, sent through his MyChart

## 2019-03-20 NOTE — Progress Notes (Signed)
Covid negative on testing

## 2019-03-21 ENCOUNTER — Other Ambulatory Visit: Payer: Self-pay | Admitting: Nurse Practitioner

## 2019-03-21 NOTE — Telephone Encounter (Signed)
Routing to provider  

## 2019-03-22 ENCOUNTER — Ambulatory Visit: Payer: 59 | Admitting: Gastroenterology

## 2019-03-29 ENCOUNTER — Other Ambulatory Visit: Payer: Self-pay | Admitting: Gastroenterology

## 2019-03-31 ENCOUNTER — Other Ambulatory Visit: Payer: Self-pay | Admitting: Nurse Practitioner

## 2019-03-31 NOTE — Telephone Encounter (Signed)
Forwarding medication refill request to PCP for review. 

## 2019-04-04 ENCOUNTER — Other Ambulatory Visit: Payer: Self-pay | Admitting: Nurse Practitioner

## 2019-04-04 NOTE — Telephone Encounter (Signed)
Requested medication (s) are due for refill today: yes  Requested medication (s) are on the active medication list: yes  Last refill: 03/21/2019  Future visit scheduled: yes  Notes to clinic: refill cannot be delegated  Requesting 90 day supply   Requested Prescriptions  Pending Prescriptions Disp Refills   tiZANidine (ZANAFLEX) 2 MG tablet [Pharmacy Med Name: TIZANIDINE HCL 2 MG TABLET] 180 tablet 1    Sig: TAKE 1 CAPSULE (2 MG TOTAL) BY MOUTH 2 (TWO) TIMES DAILY AS NEEDED FOR MUSCLE SPASMS.      Not Delegated - Cardiovascular:  Alpha-2 Agonists - tizanidine Failed - 04/04/2019  8:44 AM      Failed - This refill cannot be delegated      Passed - Valid encounter within last 6 months    Recent Outpatient Visits           3 weeks ago Cough   [place], Henrine Screws T, NP   2 months ago Aortic atherosclerosis ([place])   [place], [place] T, NP   5 months ago Asymptomatic microscopic hematuria   [place], Barbaraann Faster, NP   6 months ago Encounter for annual physical exam   [place], Henrine Screws T, NP   6 months ago Neuropathy   [place], Barbaraann Faster, NP       Future Appointments             In 6 days [name], MD [place]   In 3 months Cannady, Barbaraann Faster, NP [employer], [place]   In 3 months [place], Ronda Fairly, [place]

## 2019-04-10 ENCOUNTER — Ambulatory Visit (INDEPENDENT_AMBULATORY_CARE_PROVIDER_SITE_OTHER): Payer: BC Managed Care – PPO | Admitting: Gastroenterology

## 2019-04-10 ENCOUNTER — Telehealth: Payer: Self-pay

## 2019-04-10 DIAGNOSIS — K219 Gastro-esophageal reflux disease without esophagitis: Secondary | ICD-10-CM

## 2019-04-10 DIAGNOSIS — Z79899 Other long term (current) drug therapy: Secondary | ICD-10-CM

## 2019-04-10 NOTE — Progress Notes (Signed)
Jordan Gaines , MD [address]  [place]  [place], [place] [address]  Main: [phone]  Fax: [phone]   Primary Care Physician: Venita Lick, NP  Virtual Visit via Telephone Note  I connected with patient on 04/10/19 at  1:00 PM EST by telephone and verified that I am speaking with the correct person using two identifiers.   I discussed the limitations, risks, security and privacy concerns of performing an evaluation and management service by telephone and the availability of in person appointments. I also discussed with the patient that there may be a patient responsible charge related to this service. The patient expressed understanding and agreed to proceed.  Location of Patient: Home Location of Provider: Home Persons involved: Patient and provider only   History of Present Illness:  GERD follow up   HPI: Jordan Gaines is a 63 y.o. male    Summary of history :  Initially referred and seen in 10/2018 for GERD of 4 years duration, symptomatic when misses medications. Had been on Omeprazole 20 mg a day .   Interval history   10/2018-04/10/2019  He has switched from omeprazole 20 mg a day to famotidine 20 mg a day.  Most of the time that 20 mg of famotidine takes good care of his reflux symptoms.  Occasionally he needs to increase the dose to 20 mg twice daily.    Current Outpatient Medications  Medication Sig Dispense Refill  . tiZANidine (ZANAFLEX) 2 MG tablet TAKE 1 CAPSULE (2 MG TOTAL) BY MOUTH 2 (TWO) TIMES DAILY AS NEEDED FOR MUSCLE SPASMS. 180 tablet 1  . atorvastatin (LIPITOR) 10 MG tablet Take 1 tablet (10 mg total) by mouth daily at 6 PM. 30 tablet 5  . benzonatate (TESSALON PERLES) 100 MG capsule Take 1 capsule (100 mg total) by mouth 3 (three) times daily as needed for cough. 20 capsule 0  . chlorpheniramine-HYDROcodone (TUSSIONEX PENNKINETIC ER) 10-8 MG/5ML SUER Take 5 mLs by mouth every 12 (twelve) hours as needed for cough. 60 mL 0  .  famotidine (PEPCID) 20 MG tablet TAKE 1 TABLET BY MOUTH TWICE A DAY 180 tablet 1  . nystatin cream (MYCOSTATIN) Apply 1 application topically 2 (two) times daily. (Patient taking differently: Apply 1 application topically 2 (two) times daily as needed (skin irritation/rash.). ) 30 g 1  . tamsulosin (FLOMAX) 0.4 MG CAPS capsule Take 1 capsule (0.4 mg total) by mouth daily. 90 capsule 3  . VENTOLIN HFA 108 (90 Base) MCG/ACT inhaler TAKE 2 PUFFS BY MOUTH EVERY 6 HOURS AS NEEDED FOR WHEEZE OR SHORTNESS OF BREATH 16 g 3  . vitamin B-12 (CYANOCOBALAMIN) 500 MCG tablet Take 500 mcg by mouth daily.     No current facility-administered medications for this visit.    Allergies as of 04/10/2019  . (No Known Allergies)    Review of Systems:    All systems reviewed and negative except where noted in HPI.   Observations/Objective:  Labs: CMP     Component Value Date/Time   NA 143 09/19/2018 1552   K 4.0 09/19/2018 1552   CL 107 (H) 09/19/2018 1552   CO2 23 09/19/2018 1552   GLUCOSE 92 09/19/2018 1552   BUN 21 09/19/2018 1552   CREATININE 1.20 11/24/2018 0930   CALCIUM 9.3 09/19/2018 1552   PROT 6.8 09/19/2018 1552   ALBUMIN 4.1 09/19/2018 1552   AST 15 09/19/2018 1552   ALT 13 09/19/2018 1552   ALKPHOS 50 09/19/2018 1552  BILITOT 0.5 09/19/2018 1552   GFRNONAA 82 09/19/2018 1552   GFRAA 95 09/19/2018 1552   Lab Results  Component Value Date   WBC 7.4 09/19/2018   HGB 14.3 09/19/2018   HCT 43.2 09/19/2018   MCV 90 09/19/2018   PLT 216 09/19/2018    Imaging Studies: No results found.  Assessment and Plan:   Jordan Gaines is a 63 y.o. y/o male here to follow up for GERD.  Doing well on famotidine 20 mg once a day.  Discussed yet again lifestyle changes including timing of his meals, low-fat diet, continue to take famotidine 20 mg once a day.  If not controlled with this dose to call me back.   Follow-up as needed   I discussed the assessment and treatment plan with the  patient. The patient was provided an opportunity to ask questions and all were answered. The patient agreed with the plan and demonstrated an understanding of the instructions.   The patient was advised to call back or seek an in-person evaluation if the symptoms worsen or if the condition fails to improve as anticipated.  I provided 7 minutes of non-face-to-face time during this encounter.  Dr Jordan Bellows MD,MRCP [facility]) Gastroenterology/Hepatology Pager: [phone]   Speech recognition software was used to dictate this note.

## 2019-04-10 NOTE — Telephone Encounter (Signed)
Called pt to pre-chart for today's e-visit with Dr. Anna  Unable to contact. LVM to return call 

## 2019-04-19 NOTE — Telephone Encounter (Signed)
Spoke with pt and informed him that I spoke with his preferred pharmacy regarding the coverage for the Pepcid. Pt pharmacy states pt's insurance denied coverage because Pepcid is available for purchase over-the-counter. Pt understands and agrees to purchase the medication over-the-counter if it's not too expensive.

## 2019-04-20 ENCOUNTER — Encounter: Payer: Self-pay | Admitting: Nurse Practitioner

## 2019-04-20 NOTE — Progress Notes (Signed)
Covid return to work

## 2019-05-05 ENCOUNTER — Other Ambulatory Visit: Payer: Self-pay | Admitting: Nurse Practitioner

## 2019-05-05 ENCOUNTER — Encounter: Payer: Self-pay | Admitting: Nurse Practitioner

## 2019-05-05 MED ORDER — NYSTATIN 100000 UNIT/GM EX CREA: 1.0000 "application " | TOPICAL_CREAM | Freq: Two times a day (BID) | CUTANEOUS | 3 refills | Status: DC | PRN

## 2019-05-05 NOTE — Telephone Encounter (Signed)
Requested medication (s) are due for refill today: yes  Requested medication (s) are on the active medication list: yes  Last refill:  03/03/2019  Future visit scheduled:yes  Notes to clinic:  Medication not assigned to a protocol, review manually   Requested Prescriptions  Pending Prescriptions Disp Refills   nystatin cream (MYCOSTATIN) [Pharmacy Med Name: NYSTATIN 100,000 UNIT/GM CREAM] 30 g 1    Sig: APPLY TO AFFECTED AREA TWICE A DAY      Off-Protocol Failed - 05/05/2019 11:03 AM      Failed - Medication not assigned to a protocol, review manually.      Passed - Valid encounter within last 12 months    Recent Outpatient Visits           1 month ago Cough   [place], [place] T, NP   3 months ago Aortic atherosclerosis ([place])   [place], [place] T, NP   6 months ago Asymptomatic microscopic hematuria   [facility] [place], Barbaraann Faster, NP   7 months ago Encounter for annual physical exam   [employer], Henrine Screws T, NP   7 months ago Neuropathy   [place], Barbaraann Faster, NP       Future Appointments             In 2 months Cannady, Barbaraann Faster, NP [employer], [place]   In 2 months [place], Ronda Fairly, MD [facility]

## 2019-05-08 ENCOUNTER — Encounter: Payer: Self-pay | Admitting: Nurse Practitioner

## 2019-05-24 ENCOUNTER — Ambulatory Visit
Admission: RE | Admit: 2019-05-24 | Discharge: 2019-05-24 | Disposition: A | Discharge status: Home / Self Care | Payer: BC Managed Care – PPO | Source: Ambulatory Visit | Attending: Nurse Practitioner | Admitting: Nurse Practitioner

## 2019-05-24 ENCOUNTER — Encounter: Payer: Self-pay | Admitting: Nurse Practitioner

## 2019-05-24 ENCOUNTER — Ambulatory Visit (INDEPENDENT_AMBULATORY_CARE_PROVIDER_SITE_OTHER): Payer: BC Managed Care – PPO | Admitting: Nurse Practitioner

## 2019-05-24 ENCOUNTER — Other Ambulatory Visit: Payer: Self-pay

## 2019-05-24 VITALS — BP 138/78 | HR 77 | Temp 97.9°F

## 2019-05-24 DIAGNOSIS — M546 Pain in thoracic spine: Secondary | ICD-10-CM

## 2019-05-24 DIAGNOSIS — M170 Bilateral primary osteoarthritis of knee: Secondary | ICD-10-CM | POA: Insufficient documentation

## 2019-05-24 DIAGNOSIS — M47814 Spondylosis without myelopathy or radiculopathy, thoracic region: Secondary | ICD-10-CM | POA: Diagnosis not present

## 2019-05-24 DIAGNOSIS — G8929 Other chronic pain: Secondary | ICD-10-CM

## 2019-05-24 DIAGNOSIS — M25562 Pain in left knee: Secondary | ICD-10-CM | POA: Diagnosis not present

## 2019-05-24 DIAGNOSIS — M25561 Pain in right knee: Secondary | ICD-10-CM | POA: Insufficient documentation

## 2019-05-24 DIAGNOSIS — M1712 Unilateral primary osteoarthritis, left knee: Secondary | ICD-10-CM | POA: Diagnosis not present

## 2019-05-24 DIAGNOSIS — M1711 Unilateral primary osteoarthritis, right knee: Secondary | ICD-10-CM | POA: Diagnosis not present

## 2019-05-24 NOTE — Assessment & Plan Note (Addendum)
Chronic, will obtain imaging bilateral knee, suspect OA.  Continue OTC Voltaren gel and Lidocaine patches + Tylenol as needed.  Apply ice as needed and wear knee sleeves for support.  Will assess imaging and have return Monday for knee injection, left knee first due to worse pain in this knee.  Discussed procedure and educated him on this.  Would benefit from PT if ongoing, although works at this time and is difficult to get time off for this.

## 2019-05-24 NOTE — Assessment & Plan Note (Signed)
Ongoing, no worsening.  Suspect musculoskeletal due to frequent lifting at job. Has not been using Zanaflex, recommend he try this as needed, educated on use, to use minimally and not while working or driving. Recommend use of heat/ice as needed + OTC medications.  Will obtain imaging thoracic spine. Return to office for worsening or continued issues.

## 2019-05-24 NOTE — Progress Notes (Signed)
BP 138/78 (BP Location: Left Arm)   Pulse 77   Temp 97.9 F (36.6 C) (Oral)   SpO2 96%    Subjective:    Patient ID: KY CLUM, male    DOB: 1956-01-19, 64 y.o.   MRN: [id number]  HPI: Jordan Gaines is a 65 y.o. male  Chief Complaint  Patient presents with  . Arthritis  . Knee Pain   KNEE PAIN L>R knee pain, has been going on for several months.  Is working 64 yo 58 hours a week, all on legs and moving.  Has tried Voltaren gel and Lidocaine patches, these work but patches do not stay on at work.  Is interested in steroid injection, discussed at length and educated him on this.  Agrees to imaging first. Duration: chronic Involved knee: bilateral Mechanism of injury: unknown Location:diffuse Onset: gradual Severity: 5/10  Quality:  dull and aching Frequency: intermittent Radiation: no Aggravating factors: weight bearing, walking, bending and movement  Alleviating factors: Voltaren gel, lidocaine patches and APAP  Status: worse Treatments attempted: Voltaren gel, lidocaine patch, ice and APAP  Relief with NSAIDs?:  No NSAIDs Taken Weakness with weight bearing or walking: sometimes Sensation of giving way: sometimes Locking: no Popping: yes Bruising: no Swelling: yes Redness: no Paresthesias/decreased sensation: no Fevers: no   BACK PAIN Does heavy lifting at job, gets occasional mid to lower back discomfort bilaterally + occasional "catch" in the right side.  Has been present for several months on and off. Present more in evenings, after work, but not constant. Duration: weeks Mechanism of injury: lifting Location: midline Onset: gradual Severity: 2/10 Quality: dull and aching Frequency: intermittent Radiation: none Aggravating factors: lifting, movement and bending Alleviating factors: Tylenol Status: stable Treatments attempted: ibuprofen  Relief with NSAIDs?: Tylenol Nighttime pain:  no Paresthesias / decreased sensation:  no Bowel / bladder  incontinence:  no Fevers:  no Dysuria / urinary frequency:  no  Relevant past medical, surgical, family and social history reviewed and updated as indicated. Interim medical history since our last visit reviewed. Allergies and medications reviewed and updated.  Review of Systems  Constitutional: Negative for activity change, diaphoresis, fatigue and fever.  Respiratory: Negative for cough, chest tightness, shortness of breath and wheezing.   Cardiovascular: Negative for chest pain, palpitations and leg swelling.  Gastrointestinal: Negative.   Neurological: Negative.   Psychiatric/Behavioral: Negative.     Per HPI unless specifically indicated above     Objective:    BP 138/78 (BP Location: Left Arm)   Pulse 77   Temp 97.9 F (36.6 C) (Oral)   SpO2 96%   Wt Readings from Last 3 Encounters:  01/27/19 (!) 301 lb (136.5 kg)  12/14/18 (!) 308 lb 10.3 oz (140 kg)  12/07/18 (!) 308 lb 11.2 oz (140 kg)    Physical Exam Vitals and nursing note reviewed.  Constitutional:      General: He is awake. He is not in acute distress.    Appearance: He is well-developed and well-groomed. He is obese. He is not ill-appearing.  HENT:     Head: Normocephalic and atraumatic.     Right Ear: Hearing normal. No drainage.     Left Ear: Hearing normal. No drainage.  Eyes:     General: Lids are normal.        Right eye: No discharge.        Left eye: No discharge.     Conjunctiva/sclera: Conjunctivae normal.     Pupils: Pupils are  equal, round, and reactive to light.  Neck:     Vascular: No carotid bruit.  Cardiovascular:     Rate and Rhythm: Normal rate and regular rhythm.     Heart sounds: Normal heart sounds, S1 normal and S2 normal. No murmur. No gallop.   Pulmonary:     Effort: Pulmonary effort is normal. No accessory muscle usage or respiratory distress.     Breath sounds: Normal breath sounds.  Abdominal:     General: Bowel sounds are normal.     Palpations: Abdomen is soft.    Musculoskeletal:        General: Normal range of motion.     Cervical back: Normal range of motion and neck supple.     Thoracic back: Tenderness (to upper thoracic aspect, right side) present. No swelling, edema, signs of trauma or spasms. Normal range of motion.     Lumbar back: Normal.     Right knee: Crepitus present. No swelling, effusion or erythema. Normal range of motion. No tenderness. No LCL laxity, MCL laxity or ACL laxity. Normal alignment.     Instability Tests: Negative medial McMurray test and negative lateral McMurray test.     Left knee: Crepitus present. No swelling, effusion or erythema. Normal range of motion. No tenderness. No LCL laxity, MCL laxity or ACL laxity.Normal alignment.     Instability Tests: Negative medial McMurray test and negative lateral McMurray test.     Right lower leg: No edema.     Left lower leg: No edema.     Comments: Crepitus noted bilaterally L>R.  No decreased ROM, but mild tenderness noted with flexion and extension.  No rashes noted to back and normal ROM today.  Unable to reproduce discomfort.     Skin:    General: Skin is warm and dry.  Neurological:     Mental Status: He is alert and oriented to person, place, and time.  Psychiatric:        Attention and Perception: Attention normal.        Mood and Affect: Mood normal.        Speech: Speech normal.        Behavior: Behavior normal. Behavior is cooperative.    Results for orders placed or performed in visit on 12/22/18  Bladder Scan (Post Void Residual) in office  Result Value Ref Range   Scan Result 0       Assessment & Plan:   Problem List Items Addressed This Visit      Other   Chronic bilateral thoracic back pain    Ongoing, no worsening.  Suspect musculoskeletal due to frequent lifting at job. Has not been using Zanaflex, recommend he try this as needed, educated on use, to use minimally and not while working or driving. Recommend use of heat/ice as needed + OTC  medications.  Will obtain imaging thoracic spine. Return to office for worsening or continued issues.        Relevant Orders   DG Thoracic Spine W/Swimmers   Bilateral chronic knee pain - Primary    Chronic, will obtain imaging bilateral knee, suspect OA.  Continue OTC Voltaren gel and Lidocaine patches + Tylenol as needed.  Apply ice as needed and wear knee sleeves for support.  Will assess imaging and have return Monday for knee injection, left knee first due to worse pain in this knee.  Discussed procedure and educated him on this.  Would benefit from PT if ongoing, although works at this time  and is difficult to get time off for this.        Relevant Orders   DG Knee Complete 4 Views Right   DG Knee Complete 4 Views Left       Follow up plan: Return in about 5 days (around 05/29/2019) for Knee injection.

## 2019-05-24 NOTE — Patient Instructions (Signed)

## 2019-05-25 ENCOUNTER — Telehealth: Payer: Self-pay | Admitting: Nurse Practitioner

## 2019-05-25 ENCOUNTER — Encounter: Payer: Self-pay | Admitting: Nurse Practitioner

## 2019-05-25 NOTE — Progress Notes (Signed)
Contacted via [place] afternoon Ronalee Belts, I tried to call.  Your imaging came back and I have some not so good news.  Both knees have significant joint space loss, arthritis.  We can do the injection Friday, but it may only offer short term benefit.  Next step would be ortho and physical therapy.  At some point most likely will need replacement.  Thoracic spine does have some degenerative changes, which could be cause of some of the discomfort you are having in the muscles there.  Use your Tizanidine as needed, apply heat/ice, and get some rest in there.  Have a good afternoon.

## 2019-05-25 NOTE — Telephone Encounter (Signed)
Copied from [place] [id number]. Topic: General - Inquiry >> May 25, 2019 12:29 PM Mathis Bud wrote: Reason for CRM: Patient states PCP had called him to go over xray results. Call back [phone]

## 2019-05-25 NOTE — Telephone Encounter (Signed)
Left general HIPAA compliant message and let him know I sent MyChart message.

## 2019-05-29 ENCOUNTER — Encounter: Payer: Self-pay | Admitting: Nurse Practitioner

## 2019-05-29 ENCOUNTER — Ambulatory Visit (INDEPENDENT_AMBULATORY_CARE_PROVIDER_SITE_OTHER): Payer: BC Managed Care – PPO | Admitting: Nurse Practitioner

## 2019-05-29 ENCOUNTER — Other Ambulatory Visit: Payer: Self-pay

## 2019-05-29 DIAGNOSIS — M170 Bilateral primary osteoarthritis of knee: Secondary | ICD-10-CM | POA: Diagnosis not present

## 2019-05-29 NOTE — Assessment & Plan Note (Signed)
Chronic, ongoing with knee injection to left knee performed today.  Continue OTC Voltaren gel and Lidocaine patches + Tylenol as needed.  Apply ice as needed and wear knee sleeves for support.  Would benefit from PT if ongoing, although works at this time and is difficult to get time off for this.  Discussed with him that in long run will most likely require knee replacement based on severity of OA,

## 2019-05-29 NOTE — Progress Notes (Signed)
BP (!) 143/75   Pulse 83   Temp 98.3 F (36.8 C) (Oral)   SpO2 95%    Subjective:    Patient ID: Jordan Gaines, male    DOB: [birth year], 64 y.o.   MRN: [id number]  HPI: LUMAN BASSET is a 64 y.o. male  Chief Complaint  Patient presents with  . Knee Pain    here for left knee injection   KNEE PAIN L>R knee pain, has been going on for several months.  Is working 64 yo 58 hours a week, all on legs and moving.  Has tried Voltaren gel and Lidocaine patches, these work but patches do not stay on at work.  Is interested in steroid injection, discussed at length and educated him on this.  Risks/benefits discussed at length.  Recent imaging noted severe, near-total joint space loss of bilateral medial compartments.   Duration: chronic Involved knee: bilateral Mechanism of injury: unknown Location:diffuse Onset: gradual Severity: 5/10  Quality:  dull and aching Frequency: intermittent Radiation: no Aggravating factors: weight bearing, walking, bending and movement  Alleviating factors: Voltaren gel, lidocaine patches and APAP  Status: worse Treatments attempted: Voltaren gel, lidocaine patch, ice and APAP  Relief with NSAIDs?:  No NSAIDs Taken Weakness with weight bearing or walking: sometimes Sensation of giving way: sometimes Locking: no Popping: yes Bruising: no Swelling: yes Redness: no Paresthesias/decreased sensation: no Fevers: no    Relevant past medical, surgical, family and social history reviewed and updated as indicated. Interim medical history since our last visit reviewed. Allergies and medications reviewed and updated.  Review of Systems  Constitutional: Negative for activity change, diaphoresis, fatigue and fever.  Respiratory: Negative for cough, chest tightness, shortness of breath and wheezing.   Cardiovascular: Negative for chest pain, palpitations and leg swelling.  Gastrointestinal: Negative.   Musculoskeletal: Positive for arthralgias.    Neurological: Negative.   Psychiatric/Behavioral: Negative.     Per HPI unless specifically indicated above     Objective:    BP (!) 143/75   Pulse 83   Temp 98.3 F (36.8 C) (Oral)   SpO2 95%   Wt Readings from Last 3 Encounters:  01/27/19 (!) 301 lb (136.5 kg)  12/14/18 (!) 308 lb 10.3 oz (140 kg)  12/07/18 (!) 308 lb 11.2 oz (140 kg)    Physical Exam Vitals and nursing note reviewed.  Constitutional:      General: He is awake. He is not in acute distress.    Appearance: He is well-developed and well-groomed. He is obese. He is not ill-appearing.  HENT:     Head: Normocephalic and atraumatic.     Right Ear: Hearing normal. No drainage.     Left Ear: Hearing normal. No drainage.  Eyes:     General: Lids are normal.        Right eye: No discharge.        Left eye: No discharge.     Conjunctiva/sclera: Conjunctivae normal.     Pupils: Pupils are equal, round, and reactive to light.  Neck:     Vascular: No carotid bruit.  Cardiovascular:     Rate and Rhythm: Normal rate and regular rhythm.     Heart sounds: Normal heart sounds, S1 normal and S2 normal. No murmur. No gallop.   Pulmonary:     Effort: Pulmonary effort is normal. No accessory muscle usage or respiratory distress.     Breath sounds: Normal breath sounds.  Abdominal:     General: Bowel sounds  are normal.     Palpations: Abdomen is soft.  Musculoskeletal:        General: Normal range of motion.     Cervical back: Normal range of motion and neck supple.     Right knee: Crepitus present. No swelling, effusion or erythema. Normal range of motion. No tenderness. No LCL laxity, MCL laxity or ACL laxity. Normal alignment.     Instability Tests: Negative medial McMurray test and negative lateral McMurray test.     Left knee: Crepitus present. No swelling, effusion or erythema. Normal range of motion. No tenderness. No LCL laxity, MCL laxity or ACL laxity.Normal alignment.     Instability Tests: Negative medial  McMurray test and negative lateral McMurray test.     Right lower leg: No edema.     Left lower leg: No edema.     Comments: Crepitus noted bilaterally L>R.  No decreased ROM, but mild tenderness noted with flexion and extension.    Skin:    General: Skin is warm and dry.  Neurological:     Mental Status: He is alert and oriented to person, place, and time.  Psychiatric:        Attention and Perception: Attention normal.        Mood and Affect: Mood normal.        Speech: Speech normal.        Behavior: Behavior normal. Behavior is cooperative.    STEROID INJECTION  Procedure: Left Knee Intraarticular Steroid Injection   Description: After verbal consent and patient education on procedure, area prepped and draped using semi-sterile technique. Using a anterior  approach, a mixture of 4 cc of  1% Marcaine & 1 cc of Kenalog 40 was injected into left knee joint.  A bandage was then placed over the injection site. Complications:  none Post Procedure Instructions: To the ER if any symptoms of erythema or swelling.   Follow Up: PRN  Results for orders placed or performed in visit on 12/22/18  Bladder Scan (Post Void Residual) in office  Result Value Ref Range   Scan Result 0       Assessment & Plan:   Problem List Items Addressed This Visit      Musculoskeletal and Integument   Osteoarthritis of both knees    Chronic, ongoing with knee injection to left knee performed today.  Continue OTC Voltaren gel and Lidocaine patches + Tylenol as needed.  Apply ice as needed and wear knee sleeves for support.  Would benefit from PT if ongoing, although works at this time and is difficult to get time off for this.  Discussed with him that in long run will most likely require knee replacement based on severity of OA,          Follow up plan: Return if symptoms worsen or fail to improve.

## 2019-05-29 NOTE — Patient Instructions (Signed)

## 2019-05-30 DIAGNOSIS — M170 Bilateral primary osteoarthritis of knee: Secondary | ICD-10-CM | POA: Diagnosis not present

## 2019-05-30 MED ORDER — TRIAMCINOLONE ACETONIDE 40 MG/ML IJ SUSP
40.0000 mg | Freq: Once | INTRAMUSCULAR | Status: AC
  Administered 2019-05-30: 40 mg

## 2019-05-30 NOTE — Addendum Note (Signed)
Addended by: Georgina Peer on: 05/30/2019 09:31 AM   Modules accepted: Orders

## 2019-06-11 ENCOUNTER — Ambulatory Visit: Payer: BC Managed Care – PPO | Attending: Internal Medicine

## 2019-06-11 DIAGNOSIS — Z23 Encounter for immunization: Secondary | ICD-10-CM

## 2019-06-11 NOTE — Progress Notes (Signed)
   Covid-19 Vaccination Clinic  Name:  Jordan Gaines    MRN: [id number] DOB: 05/01/1955  06/11/2019  [name] was observed post Covid-19 immunization for 15 minutes without incidence. He was provided with Vaccine Information Sheet and instruction to access the V-Safe system.   [name] was instructed to call 911 with any severe reactions post vaccine: Marland Kitchen Difficulty breathing  . Swelling of your face and throat  . A fast heartbeat  . A bad rash all over your body  . Dizziness and weakness    Immunizations Administered    Name Date Dose VIS Date Route   Pfizer COVID-19 Vaccine 06/11/2019  8:22 AM 0.3 mL 03/24/2019 Intramuscular   Manufacturer: [place]   Lot: [id number]   [place]: [id number]

## 2019-07-03 ENCOUNTER — Ambulatory Visit: Payer: BC Managed Care – PPO

## 2019-07-03 ENCOUNTER — Ambulatory Visit: Payer: BC Managed Care – PPO | Attending: Internal Medicine

## 2019-07-03 DIAGNOSIS — Z23 Encounter for immunization: Secondary | ICD-10-CM

## 2019-07-03 NOTE — Progress Notes (Signed)
   Covid-19 Vaccination Clinic  Name:  Jordan Gaines    MRN: [id number] DOB: [birth year]  07/03/2019  [name] was observed post Covid-19 immunization for 15 minutes without incident. He was provided with Vaccine Information Sheet and instruction to access the V-Safe system.   [name] was instructed to call 911 with any severe reactions post vaccine: Marland Kitchen Difficulty breathing  . Swelling of face and throat  . A fast heartbeat  . A bad rash all over body  . Dizziness and weakness   Immunizations Administered    Name Date Dose VIS Date Route   Pfizer COVID-19 Vaccine 07/03/2019  8:20 AM 0.3 mL 03/24/2019 Intramuscular   Manufacturer: [place]   Lot: [id number]   [place]: [id number]

## 2019-07-16 ENCOUNTER — Other Ambulatory Visit: Payer: Self-pay | Admitting: Nurse Practitioner

## 2019-07-16 NOTE — Telephone Encounter (Signed)
Requested Prescriptions  Pending Prescriptions Disp Refills  . atorvastatin (LIPITOR) 10 MG tablet [Pharmacy Med Name: ATORVASTATIN 10 MG TABLET] 90 tablet 1    Sig: TAKE 1 TABLET (10 MG TOTAL) BY MOUTH DAILY AT 6 PM.     Cardiovascular:  Antilipid - Statins Failed - 07/16/2019  9:32 AM      Failed - HDL in normal range and within 360 days    HDL  Date Value Ref Range Status  10/17/2018 28 (L) >39 mg/dL Final         Passed - Total Cholesterol in normal range and within 360 days    Cholesterol, Total  Date Value Ref Range Status  10/17/2018 111 100 - 199 mg/dL Final         Passed - LDL in normal range and within 360 days    LDL Calculated  Date Value Ref Range Status  10/17/2018 64 0 - 99 mg/dL Final         Passed - Triglycerides in normal range and within 360 days    Triglycerides  Date Value Ref Range Status  10/17/2018 94 0 - 149 mg/dL Final         Passed - Patient is not pregnant      Passed - Valid encounter within last 12 months    Recent Outpatient Visits          1 month ago Primary osteoarthritis of both knees   [place], [place] T, NP   1 month ago Bilateral chronic knee pain   [place], [place] T, NP   4 months ago Cough   [place], [place] T, NP   6 months ago Aortic atherosclerosis ([place])   [place], Jolene T, NP   9 months ago Asymptomatic microscopic hematuria   [place], Barbaraann Faster, NP      Future Appointments            In 2 days Cannady, Barbaraann Faster, NP [facility]   In 2 weeks Stoioff, Ronda Fairly, MD [facility]

## 2019-07-18 ENCOUNTER — Encounter: Payer: Self-pay | Admitting: Nurse Practitioner

## 2019-07-18 ENCOUNTER — Ambulatory Visit: Payer: 59 | Admitting: Nurse Practitioner

## 2019-07-18 ENCOUNTER — Telehealth (INDEPENDENT_AMBULATORY_CARE_PROVIDER_SITE_OTHER): Payer: BC Managed Care – PPO | Admitting: Nurse Practitioner

## 2019-07-18 DIAGNOSIS — I700 Atherosclerosis of aorta: Secondary | ICD-10-CM | POA: Diagnosis not present

## 2019-07-18 DIAGNOSIS — M170 Bilateral primary osteoarthritis of knee: Secondary | ICD-10-CM | POA: Diagnosis not present

## 2019-07-18 DIAGNOSIS — E7800 Pure hypercholesterolemia, unspecified: Secondary | ICD-10-CM

## 2019-07-18 NOTE — Assessment & Plan Note (Signed)
Ongoing, recommend continued use of statin.    Educated him on benefit.  Will adjust dose as needed.  Obtain lipid panel at visit for knee injection in two weeks.

## 2019-07-18 NOTE — Patient Instructions (Signed)

## 2019-07-18 NOTE — Assessment & Plan Note (Signed)
Noted on August 2020 CT, recommend continued use of statin and monitor for control of cholesterol + recommend daily Baby ASA 81 MG for prevention.

## 2019-07-18 NOTE — Progress Notes (Signed)
Lvm to make this apt. 

## 2019-07-18 NOTE — Progress Notes (Signed)
There were no vitals taken for this visit.   Subjective:    Patient ID: Jordan Gaines, male    DOB: 1956-03-28, 64 y.o.   MRN: [id number]  HPI: Jordan Gaines is a 64 y.o. male  Chief Complaint  Patient presents with  . Hyperlipidemia    . This visit was completed via telephone due to the restrictions of the COVID-19 pandemic. All issues as above were discussed and addressed but no physical exam was performed. If it was felt that the patient should be evaluated in the office, they were directed there. The patient verbally consented to this visit. Patient was unable to complete an audio/visual visit due to Technical difficulties,Lack of internet. Due to the catastrophic nature of the COVID-19 pandemic, this visit was done through audio contact only. Patient unable to get MyChart video working. . Location of the patient: home . Location of the provider: home . Those involved with this call:  . Provider: Marnee Guarneri, DNP . CMA: Yvonna Alanis, CMA . Front Desk/Registration: Don Perking  . Time spent on call: 15 minutes on the phone discussing health concerns. 10 minutes total spent in review of patient's record and preparation of their chart.  . I verified patient identity using two factors (patient name and date of birth). Patient consents verbally to being seen via telemedicine visit today.    HYPERLIPIDEMIA Continues on Atorvastatin 10 MG daily with last labs noting LDL 64 and TCHOL 111 in July 2020. Hyperlipidemia status: good compliance Satisfied with current treatment?  yes Side effects:  no Medication compliance: good compliance Supplements: none Aspirin:  recommended to start as noted on previous urology screen -- aortic atherosclerosis The ASCVD Risk score Mikey Bussing DC Jr., et al., 2013) failed to calculate for the following reasons:   The valid total cholesterol range is 130 to 320 mg/dL Chest pain:  no Coronary artery disease:  no Family history CAD:   no Family history early CAD:  no   KNEE ARTHRITIS: Had injection to left knee on 05/29/2019 and is requesting injection to right knee in upcoming weeks.  Reports initial injection to left knee only last a couple weeks and then the discomfort returned.  He continues to use OTC Voltaren gel, Lidocaine patches, and Tylenol with benefit.  On imaging has severe degenerative changes bilateral knees.  Is aware ortho referral would be beneficial at next visit.  Relevant past medical, surgical, family and social history reviewed and updated as indicated. Interim medical history since our last visit reviewed. Allergies and medications reviewed and updated.  Review of Systems  Constitutional: Negative for activity change, diaphoresis, fatigue and fever.  Respiratory: Negative for cough, chest tightness, shortness of breath and wheezing.   Cardiovascular: Negative for chest pain, palpitations and leg swelling.  Musculoskeletal: Positive for arthralgias.  Psychiatric/Behavioral: Negative.     Per HPI unless specifically indicated above     Objective:    There were no vitals taken for this visit.  Wt Readings from Last 3 Encounters:  01/27/19 (!) 301 lb (136.5 kg)  12/14/18 (!) 308 lb 10.3 oz (140 kg)  12/07/18 (!) 308 lb 11.2 oz (140 kg)    Physical Exam   Unable to perform due to telephone visit only.  Results for orders placed or performed in visit on 12/22/18  Bladder Scan (Post Void Residual) in office  Result Value Ref Range   Scan Result 0       Assessment & Plan:   Problem List Items  Addressed This Visit      Cardiovascular and Mediastinum   Aortic atherosclerosis ([place]) - Primary    Noted on August 2020 CT, recommend continued use of statin and monitor for control of cholesterol + recommend daily Baby ASA 81 MG for prevention.          Musculoskeletal and Integument   Osteoarthritis of both knees    Ongoing, continue current OTC pain regimen.  Will plan on follow-up in  office in two weeks for steroid injection right knee + plan for ortho referral to establish care and discussed options due to significant degenerative changes bilaterally.        Other   Pure hypercholesterolemia    Ongoing, recommend continued use of statin.    Educated him on benefit.  Will adjust dose as needed.  Obtain lipid panel at visit for knee injection in two weeks.         I discussed the assessment and treatment plan with the patient. The patient was provided an opportunity to ask questions and all were answered. The patient agreed with the plan and demonstrated an understanding of the instructions.   The patient was advised to call back or seek an in-person evaluation if the symptoms worsen or if the condition fails to improve as anticipated.   I provided 15+ minutes of time during this encounter.  Follow up plan: Return in about 2 weeks (around 08/01/2019) for right knee steroid injection + cholesterol labs.

## 2019-07-18 NOTE — Assessment & Plan Note (Signed)
Ongoing, continue current OTC pain regimen.  Will plan on follow-up in office in two weeks for steroid injection right knee + plan for ortho referral to establish care and discussed options due to significant degenerative changes bilaterally.

## 2019-07-21 ENCOUNTER — Telehealth: Payer: Self-pay | Admitting: Nurse Practitioner

## 2019-07-21 NOTE — Telephone Encounter (Signed)
-----   Message from Venita Lick, NP sent at 07/18/2019 11:34 AM EDT ----- In office for right knee steroid injection + will obtain labs at that visit

## 2019-07-21 NOTE — Telephone Encounter (Signed)
lvm to make this apt.  

## 2019-07-24 ENCOUNTER — Encounter: Payer: Self-pay | Admitting: Nurse Practitioner

## 2019-07-24 ENCOUNTER — Ambulatory Visit (INDEPENDENT_AMBULATORY_CARE_PROVIDER_SITE_OTHER): Payer: BC Managed Care – PPO | Admitting: Nurse Practitioner

## 2019-07-24 ENCOUNTER — Other Ambulatory Visit: Payer: Self-pay

## 2019-07-24 DIAGNOSIS — M170 Bilateral primary osteoarthritis of knee: Secondary | ICD-10-CM | POA: Diagnosis not present

## 2019-07-24 MED ORDER — TRIAMCINOLONE ACETONIDE 40 MG/ML IJ SUSP
40.0000 mg | Freq: Once | INTRAMUSCULAR | Status: AC
  Administered 2019-07-24: 40 mg via INTRAMUSCULAR

## 2019-07-24 NOTE — Addendum Note (Signed)
Addended by: Georgina Peer on: 07/24/2019 08:52 AM   Modules accepted: Orders

## 2019-07-24 NOTE — Patient Instructions (Signed)

## 2019-07-24 NOTE — Assessment & Plan Note (Addendum)
Chronic, ongoing with knee injection to right knee performed today.  Continue OTC Voltaren gel and Lidocaine patches + Tylenol as needed.  Apply ice as needed and wear knee sleeves for support.  Would benefit from PT and ortho if ongoing, although works at this time and is difficult to get time off for this.  Discussed with him that in long run will most likely require knee replacement based on severity of OA. He was educated on reasons to immediately be seen in ER post injection.

## 2019-07-24 NOTE — Progress Notes (Signed)
BP 134/82 (BP Location: Left Arm)   Pulse 73   Temp 98 F (36.7 C) (Oral)   Wt (!) 333 lb 12.8 oz (151.4 kg)   SpO2 96%   BMI 42.86 kg/m    Subjective:    Patient ID: Jordan Gaines, male    DOB: 02-12-1956, 64 y.o.   MRN: [id number]  HPI: Jordan Gaines is a 64 y.o. male  Chief Complaint  Patient presents with  . Knee Pain    R knee injection   KNEE PAIN L>R knee pain, has been going on for several months.  Is on feet a lot at work, left knee was injected on 05/29/19 with some relief for a few weeks.  Has tried Voltaren gel and Lidocaine patches, these work but patches do not stay on at work.  Is interested in steroid injection right knee today, discussed at length and educated him on this.  Risks/benefits discussed at length.  Recent imaging noted severe, near-total joint space loss of bilateral medial compartments.  He is aware ortho and PT would be beneficial next, but due to work schedule this is difficult.  Currently is using ice packs to knees, which helps.   Duration: chronic Involved knee: bilateral Mechanism of injury: unknown Location:diffuse Onset: gradual Severity: 5/10  Quality:  dull and aching Frequency: intermittent Radiation: no Aggravating factors: weight bearing, walking, bending and movement  Alleviating factors: Voltaren gel, lidocaine patches, ice packs, and APAP  Status: worse Treatments attempted: Voltaren gel, lidocaine patch, ice and APAP  Relief with NSAIDs?:  No NSAIDs Taken Weakness with weight bearing or walking: sometimes Sensation of giving way: sometimes Locking: no Popping: yes Bruising: no Swelling: yes Redness: no Paresthesias/decreased sensation: no Fevers: no    Relevant past medical, surgical, family and social history reviewed and updated as indicated. Interim medical history since our last visit reviewed. Allergies and medications reviewed and updated.  Review of Systems  Constitutional: Negative for activity change,  diaphoresis, fatigue and fever.  Respiratory: Negative for cough, chest tightness, shortness of breath and wheezing.   Cardiovascular: Negative for chest pain, palpitations and leg swelling.  Gastrointestinal: Negative.   Musculoskeletal: Positive for arthralgias.  Neurological: Negative.   Psychiatric/Behavioral: Negative.     Per HPI unless specifically indicated above     Objective:    BP 134/82 (BP Location: Left Arm)   Pulse 73   Temp 98 F (36.7 C) (Oral)   Wt (!) 333 lb 12.8 oz (151.4 kg)   SpO2 96%   BMI 42.86 kg/m   Wt Readings from Last 3 Encounters:  07/24/19 (!) 333 lb 12.8 oz (151.4 kg)  01/27/19 (!) 301 lb (136.5 kg)  12/14/18 (!) 308 lb 10.3 oz (140 kg)    Physical Exam Vitals and nursing note reviewed.  Constitutional:      General: He is awake. He is not in acute distress.    Appearance: He is well-developed and well-groomed. He is obese. He is not ill-appearing.  HENT:     Head: Normocephalic and atraumatic.     Right Ear: Hearing normal. No drainage.     Left Ear: Hearing normal. No drainage.  Eyes:     General: Lids are normal.        Right eye: No discharge.        Left eye: No discharge.     Conjunctiva/sclera: Conjunctivae normal.     Pupils: Pupils are equal, round, and reactive to light.  Neck:  Vascular: No carotid bruit.  Cardiovascular:     Rate and Rhythm: Normal rate and regular rhythm.     Heart sounds: Normal heart sounds, S1 normal and S2 normal. No murmur. No gallop.   Pulmonary:     Effort: Pulmonary effort is normal. No accessory muscle usage or respiratory distress.     Breath sounds: Normal breath sounds.  Abdominal:     General: Bowel sounds are normal.     Palpations: Abdomen is soft.  Musculoskeletal:        General: Normal range of motion.     Cervical back: Normal range of motion and neck supple.     Right knee: Crepitus present. No swelling, effusion or erythema. Normal range of motion. No tenderness. No LCL  laxity, MCL laxity or ACL laxity. Normal alignment.     Instability Tests: Negative medial McMurray test and negative lateral McMurray test.     Left knee: Crepitus present. No swelling, effusion or erythema. Normal range of motion. No tenderness. No LCL laxity, MCL laxity or ACL laxity.Normal alignment.     Instability Tests: Negative medial McMurray test and negative lateral McMurray test.     Right lower leg: No edema.     Left lower leg: No edema.     Comments: Crepitus noted bilaterally L>R.  No decreased ROM, but mild tenderness noted with flexion and extension.    Skin:    General: Skin is warm and dry.  Neurological:     Mental Status: He is alert and oriented to person, place, and time.  Psychiatric:        Attention and Perception: Attention normal.        Mood and Affect: Mood normal.        Speech: Speech normal.        Behavior: Behavior normal. Behavior is cooperative.    STEROID INJECTION  Procedure: Right Knee Intraarticular Steroid Injection   Description: After verbal consent and patient education on procedure, area prepped and draped using semi-sterile technique. Using a anterior  approach, a mixture of 4 cc of  1% Marcaine & 1 cc of Kenalog 40 was injected into right knee joint.  A bandage was then placed over the injection site. Complications: none Post Procedure Instructions: To the ER if any symptoms of erythema or swelling.   Follow Up: PRN  Results for orders placed or performed in visit on 12/22/18  Bladder Scan (Post Void Residual) in office  Result Value Ref Range   Scan Result 0       Assessment & Plan:   Problem List Items Addressed This Visit      Musculoskeletal and Integument   Osteoarthritis of both knees    Chronic, ongoing with knee injection to right knee performed today.  Continue OTC Voltaren gel and Lidocaine patches + Tylenol as needed.  Apply ice as needed and wear knee sleeves for support.  Would benefit from PT and ortho if ongoing,  although works at this time and is difficult to get time off for this.  Discussed with him that in long run will most likely require knee replacement based on severity of OA. He was educated on reasons to immediately be seen in ER post injection.      Relevant Medications   ASPIRIN 81 PO       Follow up plan: Return in about 2 months (around 09/23/2019) for Annual physical.

## 2019-07-28 ENCOUNTER — Ambulatory Visit: Payer: 59 | Admitting: Urology

## 2019-07-30 NOTE — Progress Notes (Signed)
07/31/19 7:11 PM   [name] 1956-04-02 [id number]  Referring provider: Venita Lick, NP [address] [place],  [place] [address]  Chief Complaint  Patient presents with  . Follow-up    HPI: Jordan Gaines is a 64 y.o. M who returns today for 6 month f/u for symptom recheck.   -cysto on 12/2018 w/ dilation of 2-3 anterior urethral strictures with an estimated size of 10-14 [place] -TUR defect w/ open prostatic fossa  -Biopsies indicated inflammatory changes  -CTU 11/24/2018 indicated bony abnormality otherwise unremarkable  -Bone scan 12/21/2018 negative  -Asymptomatic   -excellent stream -denies urgency, frequency, and hesitancy -PVR 0 mL  PMH: Past Medical History:  Diagnosis Date  . GERD (gastroesophageal reflux disease)   . Kidney stones     Surgical History: Past Surgical History:  Procedure Laterality Date  . BLADDER SURGERY    . COLONOSCOPY WITH PROPOFOL N/A 10/28/2018   Procedure: COLONOSCOPY WITH PROPOFOL;  Surgeon: [name], MD;  Location: [facility] ENDOSCOPY;  Service: Gastroenterology;  Laterality: N/A;  . CYSTOSCOPY WITH BIOPSY N/A 12/14/2018   Procedure: CYSTOSCOPY WITH BIOPSY;  Surgeon: Abbie Sons, MD;  Location: [facility] ORS;  Service: Urology;  Laterality: N/A;  . CYSTOSCOPY WITH URETHRAL DILATATION N/A 12/14/2018   Procedure: CYSTOSCOPY WITH URETHRAL DILATATION;  Surgeon: Abbie Sons, MD;  Location: [facility] ORS;  Service: Urology;  Laterality: N/A;  . TONSILLECTOMY      Home Medications:  Allergies as of 07/31/2019   No Known Allergies     Medication List       Accurate as of July 31, 2019 11:59 PM. If you have any questions, ask your nurse or doctor.        ASPIRIN 81 PO Take 1 tablet by mouth daily.   atorvastatin 10 MG tablet Commonly known as: LIPITOR TAKE 1 TABLET (10 MG TOTAL) BY MOUTH DAILY AT 6 PM.   famotidine 20 MG tablet Commonly known as: PEPCID TAKE 1 TABLET BY MOUTH TWICE A DAY   nystatin cream Commonly known as:  MYCOSTATIN Apply 1 application topically 2 (two) times daily as needed (skin irritation/rash.).   tamsulosin 0.4 MG Caps capsule Commonly known as: FLOMAX Take 1 capsule (0.4 mg total) by mouth daily.   tiZANidine 2 MG tablet Commonly known as: ZANAFLEX TAKE 1 CAPSULE (2 MG TOTAL) BY MOUTH 2 (TWO) TIMES DAILY AS NEEDED FOR MUSCLE SPASMS.   vitamin B-12 500 MCG tablet Commonly known as: CYANOCOBALAMIN Take 500 mcg by mouth daily.       Allergies: No Known Allergies  Family History: Family History  Problem Relation Age of Onset  . Diabetes Mother   . Heart disease Mother   . Diabetes Father   . Multiple sclerosis Son   . Heart disease Maternal Grandfather     Social History:  reports that he has never smoked. He has never used smokeless tobacco. He reports current alcohol use. He reports that he does not use drugs.   Physical Exam: BP (!) 189/87   Pulse 96   Ht 6\' 2"  (1.88 m)   Wt (!) 328 lb (148.8 kg)   BMI 42.11 kg/m   Constitutional:  Alert and oriented, No acute distress. HEENT: [place] AT, moist mucus membranes.  Trachea midline, no masses. Cardiovascular: No clubbing, cyanosis, or edema. Respiratory: Normal respiratory effort, no increased work of breathing. Skin: No rashes, bruises or suspicious lesions. Neurologic: Grossly intact, no focal deficits, moving all 4 extremities. Psychiatric: Normal mood  and affect.  Pertinent Imaging: Results for orders placed or performed in visit on 07/31/19  Bladder Scan (Post Void Residual) in office  Result Value Ref Range   Scan Result 0 ML    Assessment & Plan:    History of anterior urethral strictures  Asymptomatic, doing well status post dilation Minimal PVR Return in 1 year or sooner if symptoms worsen    [facility] [address], [place], [place] [address] [phone]  I, Lucas Mallow, am acting as a scribe for Dr. Nicki Reaper C. Tiffancy Moger,  I have reviewed the above  documentation for accuracy and completeness, and I agree with the above.    Abbie Sons, MD

## 2019-07-31 ENCOUNTER — Ambulatory Visit (INDEPENDENT_AMBULATORY_CARE_PROVIDER_SITE_OTHER): Payer: BC Managed Care – PPO | Admitting: Urology

## 2019-07-31 ENCOUNTER — Other Ambulatory Visit: Payer: Self-pay

## 2019-07-31 VITALS — BP 189/87 | HR 96 | Ht 74.0 in | Wt 328.0 lb

## 2019-07-31 DIAGNOSIS — Z87448 Personal history of other diseases of urinary system: Secondary | ICD-10-CM

## 2019-07-31 DIAGNOSIS — R3129 Other microscopic hematuria: Secondary | ICD-10-CM | POA: Diagnosis not present

## 2019-07-31 LAB — BLADDER SCAN AMB NON-IMAGING: Scan Result: 0

## 2019-08-02 ENCOUNTER — Encounter: Payer: Self-pay | Admitting: Urology

## 2019-10-07 ENCOUNTER — Other Ambulatory Visit: Payer: Self-pay | Admitting: Gastroenterology

## 2019-10-11 ENCOUNTER — Encounter: Payer: Self-pay | Admitting: Nurse Practitioner

## 2019-10-11 ENCOUNTER — Other Ambulatory Visit: Payer: Self-pay

## 2019-10-11 ENCOUNTER — Ambulatory Visit (INDEPENDENT_AMBULATORY_CARE_PROVIDER_SITE_OTHER): Payer: BC Managed Care – PPO | Admitting: Nurse Practitioner

## 2019-10-11 VITALS — BP 130/78 | HR 70 | Temp 97.6°F | Ht 73.2 in | Wt 337.2 lb

## 2019-10-11 DIAGNOSIS — E7800 Pure hypercholesterolemia, unspecified: Secondary | ICD-10-CM | POA: Diagnosis not present

## 2019-10-11 DIAGNOSIS — K219 Gastro-esophageal reflux disease without esophagitis: Secondary | ICD-10-CM | POA: Diagnosis not present

## 2019-10-11 DIAGNOSIS — Z6841 Body Mass Index (BMI) 40.0 and over, adult: Secondary | ICD-10-CM | POA: Diagnosis not present

## 2019-10-11 DIAGNOSIS — Z125 Encounter for screening for malignant neoplasm of prostate: Secondary | ICD-10-CM

## 2019-10-11 DIAGNOSIS — Z0000 Encounter for general adult medical examination without abnormal findings: Secondary | ICD-10-CM

## 2019-10-11 DIAGNOSIS — I700 Atherosclerosis of aorta: Secondary | ICD-10-CM

## 2019-10-11 DIAGNOSIS — G629 Polyneuropathy, unspecified: Secondary | ICD-10-CM

## 2019-10-11 DIAGNOSIS — Z87448 Personal history of other diseases of urinary system: Secondary | ICD-10-CM

## 2019-10-11 NOTE — Assessment & Plan Note (Signed)
Continue collaboration with urology, has improved since procedure last year.

## 2019-10-11 NOTE — Assessment & Plan Note (Signed)
Continue daily B12 supplement, last level 400 and current discomfort improved with taking supplement. Check B12 level today.

## 2019-10-11 NOTE — Assessment & Plan Note (Signed)
Noted on August 2020 CT, recommend continued use of statin and monitor for control of cholesterol + recommend continues daily Baby ASA 81 MG for prevention.

## 2019-10-11 NOTE — Assessment & Plan Note (Signed)
Ongoing, recommend daily Pepcid at this time.  Stable with good control.  Continue current regimen and adjust as needed.  Check Mag level annually.

## 2019-10-11 NOTE — Assessment & Plan Note (Signed)
BMI 44.25.  Recommended eating smaller high protein, low fat meals more frequently and exercising 30 mins a day 5 times a week with a goal of 10-15lb weight loss in the next 3 months. Patient voiced their understanding and motivation to adhere to these recommendations.

## 2019-10-11 NOTE — Assessment & Plan Note (Signed)
Ongoing, recommend continued use of statin.    Educated him on benefit.  Will adjust dose as needed.  Obtain lipid panel today, fasting.

## 2019-10-11 NOTE — Patient Instructions (Signed)
ASK [employer] -- shingles vaccine can you get it in office or get it at pharmacy?   Healthy Eating Following a healthy eating pattern may help you to achieve and maintain a healthy body weight, reduce the risk of chronic disease, and live a long and productive life. It is important to follow a healthy eating pattern at an appropriate calorie level for your body. Your nutritional needs should be met primarily through food by choosing a variety of nutrient-rich foods. What are tips for following this plan? Reading food labels  Read labels and choose the following: ? Reduced or low sodium. ? Juices with 100% fruit juice. ? Foods with low saturated fats and high polyunsaturated and monounsaturated fats. ? Foods with whole grains, such as whole wheat, cracked wheat, brown rice, and wild rice. ? Whole grains that are fortified with folic acid. This is recommended for women who are pregnant or who want to become pregnant.  Read labels and avoid the following: ? Foods with a lot of added sugars. These include foods that contain brown sugar, corn sweetener, corn syrup, dextrose, fructose, glucose, high-fructose corn syrup, honey, invert sugar, lactose, malt syrup, maltose, molasses, raw sugar, sucrose, trehalose, or turbinado sugar.  Do not eat more than the following amounts of added sugar per day:  6 teaspoons (25 g) for women.  9 teaspoons (38 g) for men. ? Foods that contain processed or refined starches and grains. ? Refined grain products, such as white flour, degermed cornmeal, white bread, and white rice. Shopping  Choose nutrient-rich snacks, such as vegetables, whole fruits, and nuts. Avoid high-calorie and high-sugar snacks, such as potato chips, fruit snacks, and candy.  Use oil-based dressings and spreads on foods instead of solid fats such as butter, stick margarine, or cream cheese.  Limit pre-made sauces, mixes, and "instant" products such as flavored rice, instant noodles, and  ready-made pasta.  Try more plant-protein sources, such as tofu, tempeh, black beans, edamame, lentils, nuts, and seeds.  Explore eating plans such as the Mediterranean diet or vegetarian diet. Cooking  Use oil to saut or stir-fry foods instead of solid fats such as butter, stick margarine, or lard.  Try baking, boiling, grilling, or broiling instead of frying.  Remove the fatty part of meats before cooking.  Steam vegetables in water or broth. Meal planning   At meals, imagine dividing your plate into fourths: ? One-half of your plate is fruits and vegetables. ? One-fourth of your plate is whole grains. ? One-fourth of your plate is protein, especially lean meats, poultry, eggs, tofu, beans, or nuts.  Include low-fat dairy as part of your daily diet. Lifestyle  Choose healthy options in all settings, including home, work, school, restaurants, or stores.  Prepare your food safely: ? Wash your hands after handling raw meats. ? Keep food preparation surfaces clean by regularly washing with hot, soapy water. ? Keep raw meats separate from ready-to-eat foods, such as fruits and vegetables. ? Cook seafood, meat, poultry, and eggs to the recommended internal temperature. ? Store foods at safe temperatures. In general:  Keep cold foods at 60F (4.4C) or below.  Keep hot foods at 160F (60C) or above.  Keep your freezer at [facility] (-17.8C) or below.  Foods are no longer safe to eat when they have been between the temperatures of 40-160F (4.4-60C) for more than 2 hours. What foods should I eat? Fruits Aim to eat 2 cup-equivalents of fresh, canned (in natural juice), or frozen fruits each day. Examples  of 1 cup-equivalent of fruit include 1 small apple, 8 large strawberries, 1 cup canned fruit,  cup dried fruit, or 1 cup 100% juice. Vegetables Aim to eat 2-3 cup-equivalents of fresh and frozen vegetables each day, including different varieties and colors. Examples of 1  cup-equivalent of vegetables include 2 medium carrots, 2 cups raw, leafy greens, 1 cup chopped vegetable (raw or cooked), or 1 medium baked potato. Grains Aim to eat 6 ounce-equivalents of whole grains each day. Examples of 1 ounce-equivalent of grains include 1 slice of bread, 1 cup ready-to-eat cereal, 3 cups popcorn, or  cup cooked rice, pasta, or cereal. Meats and other proteins Aim to eat 5-6 ounce-equivalents of protein each day. Examples of 1 ounce-equivalent of protein include 1 egg, 1/2 cup nuts or seeds, or 1 tablespoon (16 g) peanut butter. A cut of meat or fish that is the size of a deck of cards is about 3-4 ounce-equivalents.  Of the protein you eat each week, try to have at least 8 ounces come from seafood. This includes salmon, trout, herring, and anchovies. Dairy Aim to eat 3 cup-equivalents of fat-free or low-fat dairy each day. Examples of 1 cup-equivalent of dairy include 1 cup (240 mL) milk, 8 ounces (250 g) yogurt, 1 ounces (44 g) natural cheese, or 1 cup (240 mL) fortified soy milk. Fats and oils  Aim for about 5 teaspoons (21 g) per day. Choose monounsaturated fats, such as canola and olive oils, avocados, peanut butter, and most nuts, or polyunsaturated fats, such as sunflower, corn, and soybean oils, walnuts, pine nuts, sesame seeds, sunflower seeds, and flaxseed. Beverages  Aim for six 8-oz glasses of water per day. Limit coffee to three to five 8-oz cups per day.  Limit caffeinated beverages that have added calories, such as soda and energy drinks.  Limit alcohol intake to no more than 1 drink a day for nonpregnant women and 2 drinks a day for men. One drink equals 12 oz of beer (355 mL), 5 oz of wine (148 mL), or 1 oz of hard liquor (44 mL). Seasoning and other foods  Avoid adding excess amounts of salt to your foods. Try flavoring foods with herbs and spices instead of salt.  Avoid adding sugar to foods.  Try using oil-based dressings, sauces, and spreads  instead of solid fats. This information is based on general [place] nutrition guidelines. For more information, visit [employer]. Exact amounts may vary based on your nutrition needs. Summary  A healthy eating plan may help you to maintain a healthy weight, reduce the risk of chronic diseases, and stay active throughout your life.  Plan your meals. Make sure you eat the right portions of a variety of nutrient-rich foods.  Try baking, boiling, grilling, or broiling instead of frying.  Choose healthy options in all settings, including home, work, school, restaurants, or stores. This information is not intended to replace advice given to you by your health care provider. Make sure you discuss any questions you have with your health care provider. Document Revised: 07/12/2017 Document Reviewed: 07/12/2017 Elsevier Patient Education  [place].

## 2019-10-11 NOTE — Progress Notes (Signed)
BP 130/78 (BP Location: Left Arm)   Pulse 70   Temp 97.6 F (36.4 C) (Oral)   Ht 6' 1.2" (1.859 m)   Wt (!) 337 lb 3.2 oz (153 kg)   SpO2 93%   BMI 44.25 kg/m    Subjective:    Patient ID: Jordan Gaines, male    DOB: October 01, 1955, 64 y.o.   MRN: [id number]  HPI: Jordan Gaines is a 64 y.o. male presenting on 10/11/2019 for comprehensive medical examination. Current medical complaints include:none  He currently lives with: self Interim Problems from his last visit: no   Currently being followed by urology for history of hematuria -- which has resolved since procedure last year, urethral stricture.  Last saw urology on 07/31/2019, to return in one year.  HYPERLIPIDEMIA Continues on Atorvastatin 10 MG daily + ASA. Hyperlipidemia status: good compliance Satisfied with current treatment?  yes Side effects:  no Medication compliance: good compliance Past cholesterol meds: none Supplements: none Aspirin:  yes The ASCVD Risk score Mikey Bussing DC Jr., et al., 2013) failed to calculate for the following reasons:   The valid total cholesterol range is 130 to 320 mg/dL Chest pain:  no Coronary artery disease:  no Family history CAD:  no Family history early CAD:  no   GERD Continues on Pepcid. GERD control status: stable  Satisfied with current treatment? yes Heartburn frequency:  Medication side effects: no  Medication compliance: stable Dysphagia: no Odynophagia:  no Hematemesis: no Blood in stool: no EGD: no  Functional Status Survey: Is the patient deaf or have difficulty hearing?: No Does the patient have difficulty seeing, even when wearing glasses/contacts?: No Does the patient have difficulty concentrating, remembering, or making decisions?: No Does the patient have difficulty walking or climbing stairs?: No Does the patient have difficulty dressing or bathing?: No Does the patient have difficulty doing errands alone such as visiting a doctor's office or shopping?:  No  FALL RISK: Fall Risk  10/11/2019 09/16/2018  Falls in the past year? 0 0  Number falls in past yr: 0 0  Injury with Fall? 0 0  Follow up Falls evaluation completed Falls evaluation completed    Depression Screen Depression screen [facility] 2/9 10/11/2019 09/19/2018 09/16/2018  Decreased Interest 0 0 0  Down, Depressed, Hopeless 0 0 0  PHQ - 2 Score 0 0 0  Altered sleeping - 1 -  Tired, decreased energy - 0 -  Change in appetite - 0 -  Feeling bad or failure about yourself  - 0 -  Trouble concentrating - 0 -  Moving slowly or fidgety/restless - 0 -  Suicidal thoughts - 0 -  PHQ-9 Score - 1 -  Difficult doing work/chores - Not difficult at all -    Advanced Directives <no information>  Past Medical History:  Past Medical History:  Diagnosis Date  . GERD (gastroesophageal reflux disease)   . Kidney stones     Surgical History:  Past Surgical History:  Procedure Laterality Date  . BLADDER SURGERY    . COLONOSCOPY WITH PROPOFOL N/A 10/28/2018   Procedure: COLONOSCOPY WITH PROPOFOL;  Surgeon: [name], MD;  Location: [facility] ENDOSCOPY;  Service: Gastroenterology;  Laterality: N/A;  . CYSTOSCOPY WITH BIOPSY N/A 12/14/2018   Procedure: CYSTOSCOPY WITH BIOPSY;  Surgeon: Abbie Sons, MD;  Location: [facility] ORS;  Service: Urology;  Laterality: N/A;  . CYSTOSCOPY WITH URETHRAL DILATATION N/A 12/14/2018   Procedure: CYSTOSCOPY WITH URETHRAL DILATATION;  Surgeon: Abbie Sons, MD;  Location: [facility] ORS;  Service: Urology;  Laterality: N/A;  . TONSILLECTOMY      Medications:  Current Outpatient Medications on File Prior to Visit  Medication Sig  . ASPIRIN 81 PO Take 1 tablet by mouth daily.  Marland Kitchen atorvastatin (LIPITOR) 10 MG tablet TAKE 1 TABLET (10 MG TOTAL) BY MOUTH DAILY AT 6 PM.  . famotidine (PEPCID) 20 MG tablet TAKE 1 TABLET BY MOUTH TWICE A DAY  . nystatin cream (MYCOSTATIN) Apply 1 application topically 2 (two) times daily as needed (skin irritation/rash.).  Marland Kitchen tamsulosin (FLOMAX)  0.4 MG CAPS capsule Take 1 capsule (0.4 mg total) by mouth daily.  Marland Kitchen tiZANidine (ZANAFLEX) 2 MG tablet TAKE 1 CAPSULE (2 MG TOTAL) BY MOUTH 2 (TWO) TIMES DAILY AS NEEDED FOR MUSCLE SPASMS.  . vitamin B-12 (CYANOCOBALAMIN) 500 MCG tablet Take 500 mcg by mouth daily.  . [DISCONTINUED] tizanidine (ZANAFLEX) 2 MG capsule Take 1 capsule (2 mg total) by mouth 2 (two) times daily as needed for muscle spasms.  . [DISCONTINUED] tizanidine (ZANAFLEX) 2 MG capsule TAKE 1 CAPSULE (2 MG TOTAL) BY MOUTH 2 (TWO) TIMES DAILY AS NEEDED FOR MUSCLE SPASMS.   No current facility-administered medications on file prior to visit.    Allergies:  No Known Allergies  Social History:  Social History   Socioeconomic History  . Marital status: Significant Other    Spouse name: Not on file  . Number of children: Not on file  . Years of education: Not on file  . Highest education level: Not on file  Occupational History  . Occupation: Armed forces logistics/support/administrative officer    Comment: [employer]  Tobacco Use  . Smoking status: Never Smoker  . Smokeless tobacco: Never Used  Vaping Use  . Vaping Use: Never used  Substance and Sexual Activity  . Alcohol use: Yes    Comment: rarely  . Drug use: Never  . Sexual activity: Yes  Other Topics Concern  . Not on file  Social History Narrative  . Not on file   Social Determinants of Health   Financial Resource Strain:   . Difficulty of Paying Living Expenses:   Food Insecurity:   . Worried About Charity fundraiser in the Last Year:   . Arboriculturist in the Last Year:   Transportation Needs:   . Film/video editor (Medical):   Marland Kitchen Lack of Transportation (Non-Medical):   Physical Activity:   . Days of Exercise per Week:   . Minutes of Exercise per Session:   Stress:   . Feeling of Stress :   Social Connections:   . Frequency of Communication with Friends and Family:   . Frequency of Social Gatherings with Friends and Family:   . Attends Religious Services:   . Active  Member of Clubs or Organizations:   . Attends Archivist Meetings:   Marland Kitchen Marital Status:   Intimate Partner Violence:   . Fear of Current or Ex-Partner:   . Emotionally Abused:   Marland Kitchen Physically Abused:   . Sexually Abused:    Social History   Tobacco Use  Smoking Status Never Smoker  Smokeless Tobacco Never Used   Social History   Substance and Sexual Activity  Alcohol Use Yes   Comment: rarely    Family History:  Family History  Problem Relation Age of Onset  . Diabetes Mother   . Heart disease Mother   . Diabetes Father   . Multiple sclerosis Son   . Heart disease  Maternal Grandfather     Past medical history, surgical history, medications, allergies, family history and social history reviewed with patient today and changes made to appropriate areas of the chart.   Review of Systems - negative All other ROS negative except what is listed above and in the HPI.      Objective:    BP 130/78 (BP Location: Left Arm)   Pulse 70   Temp 97.6 F (36.4 C) (Oral)   Ht 6' 1.2" (1.859 m)   Wt (!) 337 lb 3.2 oz (153 kg)   SpO2 93%   BMI 44.25 kg/m   Wt Readings from Last 3 Encounters:  10/11/19 (!) 337 lb 3.2 oz (153 kg)  07/31/19 (!) 328 lb (148.8 kg)  07/24/19 (!) 333 lb 12.8 oz (151.4 kg)    Physical Exam Vitals and nursing note reviewed.  Constitutional:      General: He is awake. He is not in acute distress.    Appearance: He is well-developed. He is morbidly obese. He is not ill-appearing.  HENT:     Head: Normocephalic and atraumatic.     Right Ear: Hearing, tympanic membrane, ear canal and external ear normal. No drainage.     Left Ear: Hearing, tympanic membrane, ear canal and external ear normal. No drainage.     Nose: Nose normal.     Mouth/Throat:     Pharynx: Uvula midline.  Eyes:     General: Lids are normal.        Right eye: No discharge.        Left eye: No discharge.     Extraocular Movements: Extraocular movements intact.      Conjunctiva/sclera: Conjunctivae normal.     Pupils: Pupils are equal, round, and reactive to light.     Visual Fields: Right eye visual fields normal and left eye visual fields normal.  Neck:     Thyroid: No thyromegaly.     Vascular: No carotid bruit or JVD.     Trachea: Trachea normal.  Cardiovascular:     Rate and Rhythm: Normal rate and regular rhythm.     Heart sounds: Normal heart sounds, S1 normal and S2 normal. No murmur heard.  No gallop.   Pulmonary:     Effort: Pulmonary effort is normal. No accessory muscle usage or respiratory distress.     Breath sounds: Normal breath sounds.  Abdominal:     General: Bowel sounds are normal.     Palpations: Abdomen is soft. There is no hepatomegaly or splenomegaly.     Tenderness: There is no abdominal tenderness.  Musculoskeletal:        General: Normal range of motion.     Cervical back: Normal range of motion and neck supple.     Right lower leg: No edema.     Left lower leg: No edema.  Lymphadenopathy:     Head:     Right side of head: No submental, submandibular, tonsillar, preauricular or posterior auricular adenopathy.     Left side of head: No submental, submandibular, tonsillar, preauricular or posterior auricular adenopathy.     Cervical: No cervical adenopathy.  Skin:    General: Skin is warm and dry.     Capillary Refill: Capillary refill takes less than 2 seconds.     Findings: No rash.  Neurological:     Mental Status: He is alert and oriented to person, place, and time.     Cranial Nerves: Cranial nerves are intact.  Gait: Gait is intact.     Deep Tendon Reflexes: Reflexes are normal and symmetric.     Reflex Scores:      Brachioradialis reflexes are 2+ on the right side and 2+ on the left side.      Patellar reflexes are 2+ on the right side and 2+ on the left side. Psychiatric:        Attention and Perception: Attention normal.        Mood and Affect: Mood normal.        Speech: Speech normal.         Behavior: Behavior normal. Behavior is cooperative.        Thought Content: Thought content normal.        Cognition and Memory: Cognition normal.        Judgment: Judgment normal.     Results for orders placed or performed in visit on 07/31/19  Bladder Scan (Post Void Residual) in office  Result Value Ref Range   Scan Result 0 ML       Assessment & Plan:   Problem List Items Addressed This Visit      Cardiovascular and Mediastinum   Aortic atherosclerosis ([place])    Noted on August 2020 CT, recommend continued use of statin and monitor for control of cholesterol + recommend continues daily Baby ASA 81 MG for prevention.        Relevant Orders   CBC with Differential/Platelet   Comprehensive metabolic panel   Lipid Panel w/o Chol/HDL Ratio     Digestive   Gastroesophageal reflux disease without esophagitis    Ongoing, recommend daily Pepcid at this time.  Stable with good control.  Continue current regimen and adjust as needed.  Check Mag level annually.      Relevant Orders   Magnesium     Nervous and Auditory   Neuropathy    Continue daily B12 supplement, last level 400 and current discomfort improved with taking supplement. Check B12 level today.      Relevant Orders   Vitamin B12     Other   Obesity    BMI 44.25.  Recommended eating smaller high protein, low fat meals more frequently and exercising 30 mins a day 5 times a week with a goal of 10-15lb weight loss in the next 3 months. Patient voiced their understanding and motivation to adhere to these recommendations.       Relevant Orders   TSH   Pure hypercholesterolemia    Ongoing, recommend continued use of statin.    Educated him on benefit.  Will adjust dose as needed.  Obtain lipid panel today, fasting.      Relevant Orders   CBC with Differential/Platelet   Lipid Panel w/o Chol/HDL Ratio   History of urethral stricture    Continue collaboration with urology, has improved since procedure last year.        Other Visit Diagnoses    PE (physical exam), annual    -  Primary   Annual labs today to include CBC, CMP, Lipid, TSH, PSA   Prostate cancer screening       PSA on labs today, joint decision with patient.   Relevant Orders   PSA      Discussed aspirin prophylaxis for myocardial infarction prevention and decision was made to continue ASA  LABORATORY TESTING:  Health maintenance labs ordered today as discussed above.   The natural history of prostate cancer and ongoing controversy regarding screening and potential treatment outcomes  of prostate cancer has been discussed with the patient. The meaning of a false positive PSA and a false negative PSA has been discussed. He indicates understanding of the limitations of this screening test and wishes to proceed with screening PSA testing.   IMMUNIZATIONS:   - Tdap: Tetanus vaccination status reviewed: last tetanus booster within 10 years. - Influenza: Up to date - Pneumovax: Up to date - Prevnar: Up to date - Zostavax vaccine: will talk to [place] to see where covered and alert provider  SCREENING: - Colonoscopy: Up to date  Discussed with patient purpose of the colonoscopy is to detect colon cancer at curable precancerous or early stages   - AAA Screening: Not applicable  -Hearing Test: Not applicable  -Spirometry: Not applicable   PATIENT COUNSELING:    Sexuality: Discussed sexually transmitted diseases, partner selection, use of condoms, avoidance of unintended pregnancy  and contraceptive alternatives.   Advised to avoid cigarette smoking.  I discussed with the patient that most people either abstain from alcohol or drink within safe limits (<=14/week and <=4 drinks/occasion for males, <=7/weeks and <= 3 drinks/occasion for females) and that the risk for alcohol disorders and other health effects rises proportionally with the number of drinks per week and how often a drinker exceeds daily limits.  Discussed cessation/primary  prevention of drug use and availability of treatment for abuse.   Diet: Encouraged to adjust caloric intake to maintain  or achieve ideal body weight, to reduce intake of dietary saturated fat and total fat, to limit sodium intake by avoiding high sodium foods and not adding table salt, and to maintain adequate dietary potassium and calcium preferably from fresh fruits, vegetables, and low-fat dairy products.    stressed the importance of regular exercise  Injury prevention: Discussed safety belts, safety helmets, smoke detector, smoking near bedding or upholstery.   Dental health: Discussed importance of regular tooth brushing, flossing, and dental visits.   Follow up plan: NEXT PREVENTATIVE PHYSICAL DUE IN 1 YEAR. Return in about 6 months (around 04/11/2020) for HLD, Elevated BP.

## 2019-10-12 LAB — COMPREHENSIVE METABOLIC PANEL
ALT: 16 IU/L (ref 0–44)
AST: 13 IU/L (ref 0–40)
Albumin/Globulin Ratio: 1.9 (ref 1.2–2.2)
Albumin: 4.4 g/dL (ref 3.8–4.8)
Alkaline Phosphatase: 63 IU/L (ref 48–121)
BUN/Creatinine Ratio: 19 (ref 10–24)
BUN: 22 mg/dL (ref 8–27)
Bilirubin Total: 0.5 mg/dL (ref 0.0–1.2)
CO2: 24 mmol/L (ref 20–29)
Calcium: 9.2 mg/dL (ref 8.6–10.2)
Chloride: 104 mmol/L (ref 96–106)
Creatinine, Ser: 1.18 mg/dL (ref 0.76–1.27)
GFR calc Af Amer: 74 mL/min/{1.73_m2} (ref >59)
GFR calc non Af Amer: 64 mL/min/{1.73_m2} (ref >59)
Globulin, Total: 2.3 g/dL (ref 1.5–4.5)
Glucose: 102 mg/dL — ABNORMAL HIGH (ref 65–99)
Potassium: 4.5 mmol/L (ref 3.5–5.2)
Sodium: 142 mmol/L (ref 134–144)
Total Protein: 6.7 g/dL (ref 6.0–8.5)

## 2019-10-12 LAB — PSA: Prostate Specific Ag, Serum: 0.5 ng/mL (ref 0.0–4.0)

## 2019-10-12 LAB — CBC WITH DIFFERENTIAL/PLATELET
Basophils Absolute: 0.1 10*3/uL (ref 0.0–0.2)
Basos: 1 %
EOS (ABSOLUTE): 0.2 10*3/uL (ref 0.0–0.4)
Eos: 3 %
Hematocrit: 43.4 % (ref 37.5–51.0)
Hemoglobin: 14.3 g/dL (ref 13.0–17.7)
Immature Grans (Abs): 0 10*3/uL (ref 0.0–0.1)
Immature Granulocytes: 0 %
Lymphocytes Absolute: 1.5 10*3/uL (ref 0.7–3.1)
Lymphs: 19 %
MCH: 30.1 pg (ref 26.6–33.0)
MCHC: 32.9 g/dL (ref 31.5–35.7)
MCV: 91 fL (ref 79–97)
Monocytes Absolute: 0.8 10*3/uL (ref 0.1–0.9)
Monocytes: 9 %
Neutrophils Absolute: 5.4 10*3/uL (ref 1.4–7.0)
Neutrophils: 68 %
Platelets: 227 10*3/uL (ref 150–450)
RBC: 4.75 x10E6/uL (ref 4.14–5.80)
RDW: 12.8 % (ref 11.6–15.4)
WBC: 7.9 10*3/uL (ref 3.4–10.8)

## 2019-10-12 LAB — MAGNESIUM: Magnesium: 2.1 mg/dL (ref 1.6–2.3)

## 2019-10-12 LAB — LIPID PANEL W/O CHOL/HDL RATIO
Cholesterol, Total: 147 mg/dL (ref 100–199)
HDL: 30 mg/dL — ABNORMAL LOW (ref >39)
LDL Chol Calc (NIH): 93 mg/dL (ref 0–99)
Triglycerides: 136 mg/dL (ref 0–149)
VLDL Cholesterol Cal: 24 mg/dL (ref 5–40)

## 2019-10-12 LAB — TSH: TSH: 1.56 u[IU]/mL (ref 0.450–4.500)

## 2019-10-12 NOTE — Progress Notes (Signed)
Contacted via [place] morning Ronalee Belts, your labs have returned and overall they look great.  B12 level has not returned yet, but I will let you know when it does. Cholesterol levels, we could get tighter LDL control to a goal <70 for stroke prevention.  In future we may consider increasing your Atorvastatin to 20 MG, we can discuss next visit.  If any questions or concerns let me know.  Have a great day!! Keep being awesome!! Kindest regards, Danne Vasek

## 2020-01-15 ENCOUNTER — Other Ambulatory Visit: Payer: BC Managed Care – PPO

## 2020-01-15 ENCOUNTER — Ambulatory Visit: Payer: Self-pay | Admitting: *Deleted

## 2020-01-15 DIAGNOSIS — Z20822 Contact with and (suspected) exposure to covid-19: Secondary | ICD-10-CM

## 2020-01-15 NOTE — Telephone Encounter (Signed)
Chills body aches, non productive cough no fever. Denies exposure to covid. Care Advice provided including increase daily water intake to 8-9 glasses daily with OTC expectorant. Urgent symptoms reviewed. Encouraged Covid testing and provided #. Reason for Disposition . Care advice for mild cough, questions about  Answer Assessment - Initial Assessment Questions 1. ONSET: "When did the nasal discharge start?"     Thursday or Froiday 2. AMOUNT: "How much discharge is there?"      Little 3. COUGH: "Do you have a cough?" If yes, ask: "Describe the color of your sputum" (clear, white, yellow, green)    none 4. RESPIRATORY DISTRESS: "Describe your breathing."      normal 5. FEVER: "Do you have a fever?" If Yes, ask: "What is your temperature, how was it measured, and when did it start?"    Chills but reports no temp 6. SEVERITY: "Overall, how bad are you feeling right now?" (e.g., doesn't interfere with normal activities, staying home from school/work, staying in bed)      Staying home 7. OTHER SYMPTOMS: "Do you have any other symptoms?" (e.g., sore throat, earache, wheezing, vomiting)     Tight chest and upper body aches 8. PREGNANCY: "Is there any chance you are pregnant?" "When was your last menstrual period?"     na  Protocols used: COMMON COLD-A-AH

## 2020-01-16 LAB — SARS-COV-2, NAA 2 DAY TAT

## 2020-01-16 LAB — NOVEL CORONAVIRUS, NAA: SARS-CoV-2, NAA: NOT DETECTED

## 2020-01-17 ENCOUNTER — Telehealth: Payer: Self-pay | Admitting: *Deleted

## 2020-01-17 NOTE — Telephone Encounter (Signed)
Patient called for COVID test result. Patient informed of negative result

## 2020-01-23 ENCOUNTER — Other Ambulatory Visit: Payer: Self-pay | Admitting: Nurse Practitioner

## 2020-01-23 ENCOUNTER — Telehealth: Payer: Self-pay | Admitting: Nurse Practitioner

## 2020-01-23 MED ORDER — HYDROCOD POLST-CPM POLST ER 10-8 MG/5ML PO SUER: 5.0000 mL | Freq: Two times a day (BID) | ORAL | 0 refills | Status: DC | PRN

## 2020-01-23 NOTE — Telephone Encounter (Addendum)
Pt has had persistent cough over a week.  He has tried musinex, delsym. Pt states he had some leftover tussionex cough med, and that is all that has helped. Pt is hoping Jolene can refill the  chlorpheniramine-HYDROcodone (TUSSIONEX PENNKINETIC ER) 10-8 MG/5ML SURE  No appts available this week  [id number] - [place], [place] - [address] Phone:  [phone]  Fax:  [phone]     Ok to [facility], pt cannot have phone on him at work.

## 2020-01-23 NOTE — Telephone Encounter (Signed)
Sent this in since no openings available this week, do recommend he schedule virtual appointment for next week though please.

## 2020-01-24 NOTE — Telephone Encounter (Signed)
Lvm letting know refills are sent in and to get scheduled.

## 2020-01-25 NOTE — Telephone Encounter (Signed)
NOW

## 2020-01-25 NOTE — Telephone Encounter (Signed)
Pt stated he is no feeling better but would call if apt was still needed

## 2020-02-14 ENCOUNTER — Other Ambulatory Visit: Payer: Self-pay | Admitting: Nurse Practitioner

## 2020-02-14 NOTE — Telephone Encounter (Signed)
Requested Prescriptions  Pending Prescriptions Disp Refills   atorvastatin (LIPITOR) 10 MG tablet [Pharmacy Med Name: ATORVASTATIN 10 MG TABLET] 90 tablet 1    Sig: TAKE 1 TABLET (10 MG TOTAL) BY MOUTH DAILY AT 6 PM.     Cardiovascular:  Antilipid - Statins Failed - 02/14/2020  1:18 AM      Failed - LDL in normal range and within 360 days    LDL Chol Calc (NIH)  Date Value Ref Range Status  10/11/2019 93 0 - 99 mg/dL Final         Failed - HDL in normal range and within 360 days    HDL  Date Value Ref Range Status  10/11/2019 30 (L) >39 mg/dL Final         Passed - Total Cholesterol in normal range and within 360 days    Cholesterol, Total  Date Value Ref Range Status  10/11/2019 147 100 - 199 mg/dL Final         Passed - Triglycerides in normal range and within 360 days    Triglycerides  Date Value Ref Range Status  10/11/2019 136 0 - 149 mg/dL Final         Passed - Patient is not pregnant      Passed - Valid encounter within last 12 months    Recent Outpatient Visits          4 months ago PE (physical exam), annual   [place], Henrine Screws T, NP   6 months ago Primary osteoarthritis of both knees   [place], Garber T, NP   7 months ago Aortic atherosclerosis ([place])   [place] Cannady, Barbaraann Faster, NP   8 months ago Primary osteoarthritis of both knees   [place], Webster T, NP   8 months ago Bilateral chronic knee pain   [place], Barbaraann Faster, NP      Future Appointments            In 1 month Cannady, Barbaraann Faster, NP [employer], [place]   In 5 months Stoioff, Ronda Fairly, MD [facility]

## 2020-03-26 ENCOUNTER — Other Ambulatory Visit: Payer: Self-pay | Admitting: Gastroenterology

## 2020-03-26 NOTE — Telephone Encounter (Signed)
Called pt to see what request he was following up on. Pt wanted to f/u on mychart message that he had with provider on 12/13. He states that provider advised him that someone would f/u with him to schedule and combine appt. I see message but message was not sent to admin to schedule. Apologized to pt he stated that he was not happy and will be sending a [employer]. Pt wanted to keep appt for 12/30 for now unless he calls to cancel.

## 2020-03-26 NOTE — Telephone Encounter (Signed)
Noted, wrote to him via MyChart:)

## 2020-03-26 NOTE — Telephone Encounter (Signed)
PT calling to f/up on his request / please advise

## 2020-04-11 ENCOUNTER — Ambulatory Visit (INDEPENDENT_AMBULATORY_CARE_PROVIDER_SITE_OTHER): Payer: BC Managed Care – PPO | Admitting: Nurse Practitioner

## 2020-04-11 ENCOUNTER — Other Ambulatory Visit: Payer: Self-pay

## 2020-04-11 ENCOUNTER — Encounter: Payer: Self-pay | Admitting: Nurse Practitioner

## 2020-04-11 VITALS — BP 130/78 | HR 69 | Temp 98.3°F | Ht 72.99 in | Wt 350.3 lb

## 2020-04-11 DIAGNOSIS — R030 Elevated blood-pressure reading, without diagnosis of hypertension: Secondary | ICD-10-CM

## 2020-04-11 DIAGNOSIS — E7800 Pure hypercholesterolemia, unspecified: Secondary | ICD-10-CM | POA: Diagnosis not present

## 2020-04-11 DIAGNOSIS — Z23 Encounter for immunization: Secondary | ICD-10-CM

## 2020-04-11 NOTE — Assessment & Plan Note (Signed)
Ongoing, recommend continued use of statin.    Educated him on benefit.  Will adjust dose as needed.  Obtain lipid panel today, fasting. °

## 2020-04-11 NOTE — Patient Instructions (Signed)
Healthy Eating Following a healthy eating pattern may help you to achieve and maintain a healthy body weight, reduce the risk of chronic disease, and live a long and productive life. It is important to follow a healthy eating pattern at an appropriate calorie level for your body. Your nutritional needs should be met primarily through food by choosing a variety of nutrient-rich foods. What are tips for following this plan? Reading food labels  Read labels and choose the following: ? Reduced or low sodium. ? Juices with 100% fruit juice. ? Foods with low saturated fats and high polyunsaturated and monounsaturated fats. ? Foods with whole grains, such as whole wheat, cracked wheat, brown rice, and wild rice. ? Whole grains that are fortified with folic acid. This is recommended for women who are pregnant or who want to become pregnant.  Read labels and avoid the following: ? Foods with a lot of added sugars. These include foods that contain brown sugar, corn sweetener, corn syrup, dextrose, fructose, glucose, high-fructose corn syrup, honey, invert sugar, lactose, malt syrup, maltose, molasses, raw sugar, sucrose, trehalose, or turbinado sugar.  Do not eat more than the following amounts of added sugar per day:  6 teaspoons (25 g) for women.  9 teaspoons (38 g) for men. ? Foods that contain processed or refined starches and grains. ? Refined grain products, such as white flour, degermed cornmeal, white bread, and white rice. Shopping  Choose nutrient-rich snacks, such as vegetables, whole fruits, and nuts. Avoid high-calorie and high-sugar snacks, such as potato chips, fruit snacks, and candy.  Use oil-based dressings and spreads on foods instead of solid fats such as butter, stick margarine, or cream cheese.  Limit pre-made sauces, mixes, and "instant" products such as flavored rice, instant noodles, and ready-made pasta.  Try more plant-protein sources, such as tofu, tempeh, black beans,  edamame, lentils, nuts, and seeds.  Explore eating plans such as the Mediterranean diet or vegetarian diet. Cooking  Use oil to saut or stir-fry foods instead of solid fats such as butter, stick margarine, or lard.  Try baking, boiling, grilling, or broiling instead of frying.  Remove the fatty part of meats before cooking.  Steam vegetables in water or broth. Meal planning   At meals, imagine dividing your plate into fourths: ? One-half of your plate is fruits and vegetables. ? One-fourth of your plate is whole grains. ? One-fourth of your plate is protein, especially lean meats, poultry, eggs, tofu, beans, or nuts.  Include low-fat dairy as part of your daily diet. Lifestyle  Choose healthy options in all settings, including home, work, school, restaurants, or stores.  Prepare your food safely: ? Wash your hands after handling raw meats. ? Keep food preparation surfaces clean by regularly washing with hot, soapy water. ? Keep raw meats separate from ready-to-eat foods, such as fruits and vegetables. ? Cook seafood, meat, poultry, and eggs to the recommended internal temperature. ? Store foods at safe temperatures. In general:  Keep cold foods at 59F (4.4C) or below.  Keep hot foods at 159F (60C) or above.  Keep your freezer at [facility] (-17.8C) or below.  Foods are no longer safe to eat when they have been between the temperatures of 40-159F (4.4-60C) for more than 2 hours. What foods should I eat? Fruits Aim to eat 2 cup-equivalents of fresh, canned (in natural juice), or frozen fruits each day. Examples of 1 cup-equivalent of fruit include 1 small apple, 8 large strawberries, 1 cup canned fruit,  cup  dried fruit, or 1 cup 100% juice. Vegetables Aim to eat 2-3 cup-equivalents of fresh and frozen vegetables each day, including different varieties and colors. Examples of 1 cup-equivalent of vegetables include 2 medium carrots, 2 cups raw, leafy greens, 1 cup chopped  vegetable (raw or cooked), or 1 medium baked potato. Grains Aim to eat 6 ounce-equivalents of whole grains each day. Examples of 1 ounce-equivalent of grains include 1 slice of bread, 1 cup ready-to-eat cereal, 3 cups popcorn, or  cup cooked rice, pasta, or cereal. Meats and other proteins Aim to eat 5-6 ounce-equivalents of protein each day. Examples of 1 ounce-equivalent of protein include 1 egg, 1/2 cup nuts or seeds, or 1 tablespoon (16 g) peanut butter. A cut of meat or fish that is the size of a deck of cards is about 3-4 ounce-equivalents.  Of the protein you eat each week, try to have at least 8 ounces come from seafood. This includes salmon, trout, herring, and anchovies. Dairy Aim to eat 3 cup-equivalents of fat-free or low-fat dairy each day. Examples of 1 cup-equivalent of dairy include 1 cup (240 mL) milk, 8 ounces (250 g) yogurt, 1 ounces (44 g) natural cheese, or 1 cup (240 mL) fortified soy milk. Fats and oils  Aim for about 5 teaspoons (21 g) per day. Choose monounsaturated fats, such as canola and olive oils, avocados, peanut butter, and most nuts, or polyunsaturated fats, such as sunflower, corn, and soybean oils, walnuts, pine nuts, sesame seeds, sunflower seeds, and flaxseed. Beverages  Aim for six 8-oz glasses of water per day. Limit coffee to three to five 8-oz cups per day.  Limit caffeinated beverages that have added calories, such as soda and energy drinks.  Limit alcohol intake to no more than 1 drink a day for nonpregnant women and 2 drinks a day for men. One drink equals 12 oz of beer (355 mL), 5 oz of wine (148 mL), or 1 oz of hard liquor (44 mL). Seasoning and other foods  Avoid adding excess amounts of salt to your foods. Try flavoring foods with herbs and spices instead of salt.  Avoid adding sugar to foods.  Try using oil-based dressings, sauces, and spreads instead of solid fats. This information is based on general [place] nutrition guidelines. For more  information, visit [employer]. Exact amounts may vary based on your nutrition needs. Summary  A healthy eating plan may help you to maintain a healthy weight, reduce the risk of chronic diseases, and stay active throughout your life.  Plan your meals. Make sure you eat the right portions of a variety of nutrient-rich foods.  Try baking, boiling, grilling, or broiling instead of frying.  Choose healthy options in all settings, including home, work, school, restaurants, or stores. This information is not intended to replace advice given to you by your health care provider. Make sure you discuss any questions you have with your health care provider. Document Revised: 07/12/2017 Document Reviewed: 07/12/2017 Elsevier Patient Education  [place].

## 2020-04-11 NOTE — Assessment & Plan Note (Signed)
Ongoing, suspect white coat.  Initial elevated, but repeat below goal.  Continue DASH diet focus and obtain BMP.

## 2020-04-11 NOTE — Progress Notes (Signed)
BP 130/78   Pulse 69   Temp 98.3 F (36.8 C) (Oral)   Ht 6' 0.99" (1.854 m)   Wt (!) 350 lb 4.8 oz (158.9 kg)   SpO2 96%   BMI 46.23 kg/m    Subjective:    Patient ID: VICTORMANUEL Gaines, male    DOB: 04/05/1956, 64 y.o.   MRN: [id number]  HPI: Jordan Gaines is a 64 y.o. male  Chief Complaint  Patient presents with  . Hypertension  . Hyperlipidemia   HYPERTENSION / HYPERLIPIDEMIA Continues on ASA, Atorvastatin, and no current BP medications. Duration of hypertension: years BP monitoring frequency: not checking BP range:  Duration of hyperlipidemia: chronic Cholesterol medication side effects: no Cholesterol supplements: none Medication compliance: good compliance Aspirin: no Recent stressors: no Recurrent headaches: no Visual changes: no Palpitations: no Dyspnea: no Chest pain: no Lower extremity edema: no Dizzy/lightheaded: no  Relevant past medical, surgical, family and social history reviewed and updated as indicated. Interim medical history since our last visit reviewed. Allergies and medications reviewed and updated.  Review of Systems  Constitutional: Negative for activity change, diaphoresis, fatigue and fever.  Respiratory: Negative for cough, chest tightness, shortness of breath and wheezing.   Cardiovascular: Negative for chest pain, palpitations and leg swelling.  Gastrointestinal: Negative.   Neurological: Negative.   Psychiatric/Behavioral: Negative.     Per HPI unless specifically indicated above     Objective:    BP 130/78   Pulse 69   Temp 98.3 F (36.8 C) (Oral)   Ht 6' 0.99" (1.854 m)   Wt (!) 350 lb 4.8 oz (158.9 kg)   SpO2 96%   BMI 46.23 kg/m   Wt Readings from Last 3 Encounters:  04/11/20 (!) 350 lb 4.8 oz (158.9 kg)  10/11/19 (!) 337 lb 3.2 oz (153 kg)  07/31/19 (!) 328 lb (148.8 kg)    Physical Exam Vitals and nursing note reviewed.  Constitutional:      General: He is awake. He is not in acute distress.    Appearance:  He is well-developed and well-groomed. He is morbidly obese. He is not ill-appearing.  HENT:     Head: Normocephalic and atraumatic.     Right Ear: Hearing normal. No drainage.     Left Ear: Hearing normal. No drainage.  Eyes:     General: Lids are normal.        Right eye: No discharge.        Left eye: No discharge.     Conjunctiva/sclera: Conjunctivae normal.     Pupils: Pupils are equal, round, and reactive to light.  Neck:     Thyroid: No thyromegaly.     Vascular: No carotid bruit.     Trachea: Trachea normal.  Cardiovascular:     Rate and Rhythm: Normal rate and regular rhythm.     Heart sounds: Normal heart sounds, S1 normal and S2 normal. No murmur heard. No gallop.   Pulmonary:     Effort: Pulmonary effort is normal. No accessory muscle usage or respiratory distress.     Breath sounds: Normal breath sounds.  Abdominal:     General: Bowel sounds are normal.     Palpations: Abdomen is soft. There is no hepatomegaly or splenomegaly.  Musculoskeletal:        General: Normal range of motion.     Cervical back: Normal range of motion and neck supple.     Right lower leg: No edema.     Left lower  leg: No edema.  Skin:    General: Skin is warm and dry.     Capillary Refill: Capillary refill takes less than 2 seconds.  Neurological:     Mental Status: He is alert and oriented to person, place, and time.     Deep Tendon Reflexes: Reflexes are normal and symmetric.  Psychiatric:        Attention and Perception: Attention normal.        Mood and Affect: Mood normal.        Speech: Speech normal.        Behavior: Behavior normal. Behavior is cooperative.        Thought Content: Thought content normal.    Results for orders placed or performed in visit on 01/15/20  Novel Coronavirus, NAA ([facility])   Specimen: Nasopharyngeal(NP) swabs in vial transport medium   Nasopharynge  Screenin  Result Value Ref Range   SARS-CoV-2, NAA Not Detected Not Detected  SARS-COV-2, NAA 2  DAY TAT   Nasopharynge  Screenin  Result Value Ref Range   SARS-CoV-2, NAA 2 DAY TAT Performed       Assessment & Plan:   Problem List Items Addressed This Visit      Other   Pure hypercholesterolemia    Ongoing, recommend continued use of statin.  Educated him on benefit.  Will adjust dose as needed.  Obtain lipid panel today, fasting.      Relevant Orders   Basic metabolic panel   Lipid Panel w/o Chol/HDL Ratio   Elevated BP without diagnosis of hypertension - Primary    Ongoing, suspect white coat.  Initial elevated, but repeat below goal.  Continue DASH diet focus and obtain BMP.       Other Visit Diagnoses    Need for pneumococcal vaccination       Pneumonia vaccine today   Relevant Orders   Pneumococcal polysaccharide vaccine 23-valent greater than or equal to 2yo subcutaneous/IM (Completed)       Follow up plan: Return in about 6 months (around 10/10/2020) for Annual physical due after 10/10/20.

## 2020-04-12 LAB — LIPID PANEL W/O CHOL/HDL RATIO
Cholesterol, Total: 156 mg/dL (ref 100–199)
HDL: 31 mg/dL — ABNORMAL LOW (ref >39)
LDL Chol Calc (NIH): 96 mg/dL (ref 0–99)
Triglycerides: 164 mg/dL — ABNORMAL HIGH (ref 0–149)
VLDL Cholesterol Cal: 29 mg/dL (ref 5–40)

## 2020-04-12 LAB — BASIC METABOLIC PANEL
BUN/Creatinine Ratio: 18 (ref 10–24)
BUN: 19 mg/dL (ref 8–27)
CO2: 25 mmol/L (ref 20–29)
Calcium: 9.1 mg/dL (ref 8.6–10.2)
Chloride: 103 mmol/L (ref 96–106)
Creatinine, Ser: 1.03 mg/dL (ref 0.76–1.27)
GFR calc Af Amer: 87 mL/min/{1.73_m2} (ref >59)
GFR calc non Af Amer: 75 mL/min/{1.73_m2} (ref >59)
Glucose: 101 mg/dL — ABNORMAL HIGH (ref 65–99)
Potassium: 4.7 mmol/L (ref 3.5–5.2)
Sodium: 140 mmol/L (ref 134–144)

## 2020-04-13 NOTE — Progress Notes (Signed)
Contacted via MyChart   Good morning Jordan Gaines, your labs have returned.  Kidney function remains stable.  Glucose, sugar, was mildly elevated -- would recommend focus on lowering sugar in diet and carbohydrates.  Cholesterol levels are remaining stable with Atorvastatin, but would like to see LDL less than 70 for stroke prevention.  Would be good to increase Atorvastatin to 20 MG daily, if you are okay with doing this let me know.  Any questions? Keep being awesome!!  Thank you for allowing me to participate in your care. Kindest regards, Vassie Kugel

## 2020-04-15 ENCOUNTER — Other Ambulatory Visit: Payer: Self-pay | Admitting: Nurse Practitioner

## 2020-04-15 MED ORDER — ATORVASTATIN CALCIUM 20 MG PO TABS: 20.0000 mg | ORAL_TABLET | Freq: Every day | ORAL | 3 refills | Status: DC

## 2020-06-17 ENCOUNTER — Other Ambulatory Visit: Payer: Self-pay | Admitting: Nurse Practitioner

## 2020-06-17 DIAGNOSIS — M170 Bilateral primary osteoarthritis of knee: Secondary | ICD-10-CM

## 2020-06-18 DIAGNOSIS — M170 Bilateral primary osteoarthritis of knee: Secondary | ICD-10-CM | POA: Diagnosis not present

## 2020-07-29 ENCOUNTER — Ambulatory Visit: Payer: BC Managed Care – PPO | Admitting: Urology

## 2020-08-08 ENCOUNTER — Ambulatory Visit: Payer: BC Managed Care – PPO | Admitting: Urology

## 2020-08-16 ENCOUNTER — Encounter: Payer: Self-pay | Admitting: Urology

## 2020-08-20 ENCOUNTER — Other Ambulatory Visit: Payer: Self-pay | Admitting: Nurse Practitioner

## 2020-08-20 MED ORDER — NYSTATIN 100000 UNIT/GM EX CREA: 1.0000 "application " | TOPICAL_CREAM | Freq: Two times a day (BID) | CUTANEOUS | 3 refills | Status: DC | PRN

## 2020-09-05 ENCOUNTER — Ambulatory Visit (INDEPENDENT_AMBULATORY_CARE_PROVIDER_SITE_OTHER): Payer: BC Managed Care – PPO | Admitting: Urology

## 2020-09-05 ENCOUNTER — Encounter: Payer: Self-pay | Admitting: Urology

## 2020-09-05 ENCOUNTER — Other Ambulatory Visit: Payer: Self-pay

## 2020-09-05 VITALS — BP 190/105 | HR 101 | Ht 74.0 in | Wt 350.0 lb

## 2020-09-05 DIAGNOSIS — N433 Hydrocele, unspecified: Secondary | ICD-10-CM | POA: Diagnosis not present

## 2020-09-05 DIAGNOSIS — Z87448 Personal history of other diseases of urinary system: Secondary | ICD-10-CM | POA: Diagnosis not present

## 2020-09-05 LAB — BLADDER SCAN AMB NON-IMAGING: Scan Result: 3

## 2020-09-05 MED ORDER — SILDENAFIL CITRATE 20 MG PO TABS: ORAL_TABLET | ORAL | 0 refills | Status: AC

## 2020-09-05 NOTE — Progress Notes (Signed)
09/05/2020 8:48 AM   Jordan Gaines September 14, 1955 [id number]  Referring provider: Venita Lick, NP [address] [place],  [place] [address]  Chief Complaint  Patient presents with  . Other    Urologic history: 1.  History anterior urethral stricture  Cystoscopy with urethral dilation 12/14/2018  Benign bladder biopsies for mild mucosal erythema  2.  BPH  Prior TURP   HPI: 65 y.o. male presents for annual follow-up   ~ 6 weeks ago he noted bilateral scrotal swelling  Size has been stable; asymptomatic  No bothersome LUTS  PSA performed by PCP June 2021 0.5  Takes tamsulosin prn intermittent voiding symptoms  Denies dysuria, gross hematuria  No flank, abdominal or pelvic pain   PMH: Past Medical History:  Diagnosis Date  . GERD (gastroesophageal reflux disease)   . Kidney stones     Surgical History: Past Surgical History:  Procedure Laterality Date  . BLADDER SURGERY    . COLONOSCOPY WITH PROPOFOL N/A 10/28/2018   Procedure: COLONOSCOPY WITH PROPOFOL;  Surgeon: [name], MD;  Location: [facility] ENDOSCOPY;  Service: Gastroenterology;  Laterality: N/A;  . CYSTOSCOPY WITH BIOPSY N/A 12/14/2018   Procedure: CYSTOSCOPY WITH BIOPSY;  Surgeon: Abbie Sons, MD;  Location: [facility] ORS;  Service: Urology;  Laterality: N/A;  . CYSTOSCOPY WITH URETHRAL DILATATION N/A 12/14/2018   Procedure: CYSTOSCOPY WITH URETHRAL DILATATION;  Surgeon: Abbie Sons, MD;  Location: [facility] ORS;  Service: Urology;  Laterality: N/A;  . TONSILLECTOMY      Home Medications:  Allergies as of 09/05/2020   No Known Allergies     Medication List       Accurate as of Sep 05, 2020  8:48 AM. If you have any questions, ask your nurse or doctor.        ASPIRIN 81 PO Take 1 tablet by mouth daily.   atorvastatin 20 MG tablet Commonly known as: LIPITOR Take 1 tablet (20 mg total) by mouth daily.   chlorpheniramine-HYDROcodone 10-8 MG/5ML Suer Commonly known as: Tussionex Pennkinetic  ER Take 5 mLs by mouth every 12 (twelve) hours as needed for cough.   famotidine 20 MG tablet Commonly known as: PEPCID TAKE 1 TABLET BY MOUTH TWICE A DAY   nystatin cream Commonly known as: MYCOSTATIN Apply 1 application topically 2 (two) times daily as needed (skin irritation/rash.).   tamsulosin 0.4 MG Caps capsule Commonly known as: FLOMAX Take 1 capsule (0.4 mg total) by mouth daily.   tiZANidine 2 MG tablet Commonly known as: ZANAFLEX TAKE 1 CAPSULE (2 MG TOTAL) BY MOUTH 2 (TWO) TIMES DAILY AS NEEDED FOR MUSCLE SPASMS.   vitamin B-12 500 MCG tablet Commonly known as: CYANOCOBALAMIN Take 500 mcg by mouth daily.       Allergies: No Known Allergies  Family History: Family History  Problem Relation Age of Onset  . Diabetes Mother   . Heart disease Mother   . Diabetes Father   . Multiple sclerosis Son   . Heart disease Maternal Grandfather     Social History:  reports that he has never smoked. He has never used smokeless tobacco. He reports current alcohol use. He reports that he does not use drugs.   Physical Exam: BP (!) 190/105   Pulse (!) 101   Ht 6\' 2"  (1.88 m)   Wt (!) 350 lb (158.8 kg)   BMI 44.94 kg/m   Constitutional:  Alert and oriented, No acute distress. HEENT: [place] AT, moist mucus membranes.  Trachea midline, no  masses. Cardiovascular: No clubbing, cyanosis, or edema. Respiratory: Normal respiratory effort, no increased work of breathing. GU: Phallus without lesions; testes descended bilaterally without masses or tenderness; small bilateral hydroceles L >R; prostate 35 g, smooth without nodules Skin: No rashes, bruises or suspicious lesions. Neurologic: Grossly intact, no focal deficits, moving all 4 extremities. Psychiatric: Normal mood and affect.   Assessment & Plan:    1. H/O urethral stricture  Doing well, no bothersome LUTS  Bladder scan PVR 3 mL  2.  Bilateral hydrocele  Small, bilateral hydroceles-asymptomatic  Reassured and  recommended observation  Annual follow-up   Abbie Sons, MD  [facility] [address] [place], [place] [address] [phone]

## 2020-09-12 NOTE — Telephone Encounter (Signed)
Please reach out to patient to see about getting him scheduled.  Please see his message

## 2020-09-12 NOTE — Telephone Encounter (Signed)
Spoke with Pt, scheduled for the next available that he could do. Declined sooner apts with other provider.

## 2020-09-13 ENCOUNTER — Encounter: Payer: Self-pay | Admitting: Emergency Medicine

## 2020-09-13 ENCOUNTER — Emergency Department: Payer: BC Managed Care – PPO

## 2020-09-13 ENCOUNTER — Emergency Department
Admission: EM | Admit: 2020-09-13 | Discharge: 2020-09-13 | Disposition: A | Discharge status: Home / Self Care | Payer: BC Managed Care – PPO | Attending: Emergency Medicine | Admitting: Emergency Medicine

## 2020-09-13 ENCOUNTER — Other Ambulatory Visit: Payer: Self-pay | Admitting: Gastroenterology

## 2020-09-13 ENCOUNTER — Other Ambulatory Visit: Payer: Self-pay

## 2020-09-13 DIAGNOSIS — R079 Chest pain, unspecified: Secondary | ICD-10-CM

## 2020-09-13 DIAGNOSIS — R0789 Other chest pain: Secondary | ICD-10-CM | POA: Diagnosis not present

## 2020-09-13 DIAGNOSIS — M546 Pain in thoracic spine: Secondary | ICD-10-CM | POA: Diagnosis not present

## 2020-09-13 DIAGNOSIS — Z7982 Long term (current) use of aspirin: Secondary | ICD-10-CM | POA: Insufficient documentation

## 2020-09-13 LAB — BASIC METABOLIC PANEL
Anion gap: 9 (ref 5–15)
BUN: 19 mg/dL (ref 8–23)
CO2: 26 mmol/L (ref 22–32)
Calcium: 8.7 mg/dL — ABNORMAL LOW (ref 8.9–10.3)
Chloride: 105 mmol/L (ref 98–111)
Creatinine, Ser: 1.04 mg/dL (ref 0.61–1.24)
GFR, Estimated: >60 mL/min (ref >60)
Glucose, Bld: 114 mg/dL — ABNORMAL HIGH (ref 70–99)
Potassium: 4.3 mmol/L (ref 3.5–5.1)
Sodium: 140 mmol/L (ref 135–145)

## 2020-09-13 LAB — CBC
HCT: 41.8 % (ref 39.0–52.0)
Hemoglobin: 14 g/dL (ref 13.0–17.0)
MCH: 29.7 pg (ref 26.0–34.0)
MCHC: 33.5 g/dL (ref 30.0–36.0)
MCV: 88.7 fL (ref 80.0–100.0)
Platelets: 207 10*3/uL (ref 150–400)
RBC: 4.71 MIL/uL (ref 4.22–5.81)
RDW: 12.7 % (ref 11.5–15.5)
WBC: 8.1 10*3/uL (ref 4.0–10.5)
nRBC: 0 % (ref 0.0–0.2)

## 2020-09-13 LAB — TROPONIN I (HIGH SENSITIVITY)
Troponin I (High Sensitivity): 17 ng/L (ref <18)
Troponin I (High Sensitivity): 17 ng/L (ref <18)

## 2020-09-13 LAB — D-DIMER, QUANTITATIVE: D-Dimer, Quant: 0.39 ug/mL-FEU (ref 0.00–0.50)

## 2020-09-13 NOTE — ED Provider Notes (Signed)
[facility] Emergency Department Provider Note  ____________________________________________   Event Date/Time   First MD Initiated Contact with Patient 09/13/20 [phone]     (approximate)  I have reviewed the triage vital signs and the nursing notes.   HISTORY  Chief Complaint No chief complaint on file.    HPI Jordan Gaines is a 65 y.o. male with history of obesity, hyperlipidemia who presents to the emergency department with complaints of left-sided chest discomfort that he describes as a tightness that occurred 2 days ago.  Came on suddenly and last for several seconds and then resolved.  No aggravating or alleviating factors.  States he felt okay until tonight when he started having left upper back pain that felt like a throbbing, "rubbing" pain.  This also only last for a few seconds and resolves but does come and go.  No associated shortness of breath, nausea, vomiting, diaphoresis, dizziness.  Not reproducible with palpation, movement.  No injury to his back.  No history of CAD.  No history of PE, DVT, exogenous estrogen use, recent fractures, surgery, trauma, hospitalization, prolonged travel or other immobilization. No lower extremity swelling or pain. No calf tenderness.        Past Medical History:  Diagnosis Date  . GERD (gastroesophageal reflux disease)   . Kidney stones     Patient Active Problem List   Diagnosis Date Noted  . Elevated BP without diagnosis of hypertension 04/11/2020  . Osteoarthritis of both knees 05/24/2019  . Chronic bilateral thoracic back pain 01/17/2019  . Aortic atherosclerosis ([place]) 01/15/2019  . Pure hypercholesterolemia 10/17/2018  . History of urethral stricture 10/17/2018  . Onychomycosis 10/17/2018  . Gastroesophageal reflux disease without esophagitis 09/19/2018  . Obesity 09/19/2018  . History of kidney stones 09/16/2018  . Neuropathy 09/16/2018    Past Surgical History:  Procedure Laterality Date  .  BLADDER SURGERY    . COLONOSCOPY WITH PROPOFOL N/A 10/28/2018   Procedure: COLONOSCOPY WITH PROPOFOL;  Surgeon: [name], MD;  Location: [facility] ENDOSCOPY;  Service: Gastroenterology;  Laterality: N/A;  . CYSTOSCOPY WITH BIOPSY N/A 12/14/2018   Procedure: CYSTOSCOPY WITH BIOPSY;  Surgeon: Abbie Sons, MD;  Location: [facility] ORS;  Service: Urology;  Laterality: N/A;  . CYSTOSCOPY WITH URETHRAL DILATATION N/A 12/14/2018   Procedure: CYSTOSCOPY WITH URETHRAL DILATATION;  Surgeon: Abbie Sons, MD;  Location: [facility] ORS;  Service: Urology;  Laterality: N/A;  . TONSILLECTOMY      Prior to Admission medications   Medication Sig Start Date End Date Taking? Authorizing Provider  ASPIRIN 81 PO Take 1 tablet by mouth daily.    [provider]  atorvastatin (LIPITOR) 20 MG tablet Take 1 tablet (20 mg total) by mouth daily. 04/15/20   Marnee Guarneri T, NP  chlorpheniramine-HYDROcodone (TUSSIONEX PENNKINETIC ER) 10-8 MG/5ML SUER Take 5 mLs by mouth every 12 (twelve) hours as needed for cough. 01/23/20   Cannady, Henrine Screws T, NP  famotidine (PEPCID) 20 MG tablet TAKE 1 TABLET BY MOUTH TWICE A DAY 03/26/20   [name], MD  nystatin cream (MYCOSTATIN) Apply 1 application topically 2 (two) times daily as needed (skin irritation/rash.). 08/20/20   Cannady, Henrine Screws T, NP  sildenafil (REVATIO) 20 MG tablet Take 2-5 tablets 1 hour prior to intercourse. 09/05/20   Stoioff, Ronda Fairly, MD  tamsulosin (FLOMAX) 0.4 MG CAPS capsule Take 1 capsule (0.4 mg total) by mouth daily. 01/13/19   Stoioff, Ronda Fairly, MD  tiZANidine (ZANAFLEX) 2 MG tablet TAKE 1 CAPSULE (  2 MG TOTAL) BY MOUTH 2 (TWO) TIMES DAILY AS NEEDED FOR MUSCLE SPASMS. 04/04/19   Cannady, Henrine Screws T, NP  vitamin B-12 (CYANOCOBALAMIN) 500 MCG tablet Take 500 mcg by mouth daily.    [provider]    Allergies Patient has no known allergies.  Family History  Problem Relation Age of Onset  . Diabetes Mother   . Heart disease Mother   . Diabetes Father    . Multiple sclerosis Son   . Heart disease Maternal Grandfather     Social History Social History   Tobacco Use  . Smoking status: Never Smoker  . Smokeless tobacco: Never Used  Vaping Use  . Vaping Use: Never used  Substance Use Topics  . Alcohol use: Yes    Comment: rarely  . Drug use: Never    Review of Systems Constitutional: No fever. Eyes: No visual changes. ENT: No sore throat. Cardiovascular: + chest pain. Respiratory: Denies shortness of breath. Gastrointestinal: No nausea, vomiting, diarrhea. Genitourinary: Negative for dysuria. Musculoskeletal: Negative for back pain. Skin: Negative for rash. Neurological: Negative for focal weakness or numbness.  ____________________________________________   PHYSICAL EXAM:  VITAL SIGNS: ED Triage Vitals  Enc Vitals Group     BP 09/13/20 0552 135/69     Pulse Rate 09/13/20 0552 77     Resp 09/13/20 0552 (!) 22     Temp 09/13/20 0552 98.2 F (36.8 C)     Temp Source 09/13/20 0552 Oral     SpO2 09/13/20 0552 93 %     Weight 09/13/20 0550 (!) 350 lb (158.8 kg)     Height 09/13/20 0550 6\' 2"  (1.88 m)     Head Circumference --      Peak Flow --      Pain Score --      Pain Loc --      Pain Edu? --      Excl. in [place]? --    CONSTITUTIONAL: Alert and oriented and responds appropriately to questions.  Obese, in no distress HEAD: Normocephalic EYES: Conjunctivae clear, pupils appear equal, EOM appear intact ENT: normal nose; moist mucous membranes NECK: Supple, normal ROM CARD: RRR; S1 and S2 appreciated; no murmurs, no clicks, no rubs, no gallops RESP: Normal chest excursion without splinting or tachypnea; breath sounds clear and equal bilaterally; no wheezes, no rhonchi, no rales, no hypoxia or respiratory distress, speaking full sentences ABD/GI: Normal bowel sounds; non-distended; soft, non-tender, no rebound, no guarding, no peritoneal signs, no hepatosplenomegaly BACK: The back appears normal, no midline spinal  tenderness or step-off or deformity, unable to reproduce pain with palpation of his paraspinal muscles EXT: Normal ROM in all joints; no deformity noted, no edema; no cyanosis, no calf tenderness or calf swelling SKIN: Normal color for age and race; warm; no rash on exposed skin NEURO: Moves all extremities equally, normal gait, normal speech, no facial asymmetry PSYCH: The patient's mood and manner are appropriate.  ____________________________________________   LABS (all labs ordered are listed, but only abnormal results are displayed)  Labs Reviewed  BASIC METABOLIC PANEL - Abnormal; Notable for the following components:      Result Value   Glucose, Bld 114 (*)    Calcium 8.7 (*)    All other components within normal limits  CBC  D-DIMER, QUANTITATIVE  TROPONIN I (HIGH SENSITIVITY)   ____________________________________________  EKG   EKG Interpretation  Date/Time:  Friday September 13 2020 05:57:35 EDT Ventricular Rate:  76 PR Interval:  160 QRS Duration:  118 QT Interval:  410 QTC Calculation: 461 R Axis:   -42 Text Interpretation: Normal sinus rhythm Left axis deviation Non-specific intra-ventricular conduction delay Minimal voltage criteria for LVH, may be normal variant ( Cornell product ) Abnormal ECG Confirmed by Pryor Curia [phone]) on 09/13/2020 6:00:05 AM       ____________________________________________  RADIOLOGY Jessie Foot Lacee Grey, personally viewed and evaluated these images (plain radiographs) as part of my medical decision making, as well as reviewing the written report by the radiologist.  ED MD interpretation: Chest x-ray clear.  Official radiology report(s): DG Chest 2 View  Result Date: 09/13/2020 CLINICAL DATA:  65 year old male with history of chest pain and left upper back pain for the past 2 days. EXAM: CHEST - 2 VIEW COMPARISON:  Chest x-ray 07/29/2011. FINDINGS: Lung volumes are normal. No consolidative airspace disease. No pleural effusions. No  pneumothorax. No pulmonary nodule or mass noted. Pulmonary vasculature and the cardiomediastinal silhouette are within normal limits. IMPRESSION: No radiographic evidence of acute cardiopulmonary disease. Electronically Signed   By: Vinnie Langton M.D.   On: 09/13/2020 06:58    ____________________________________________   PROCEDURES  Procedure(s) performed (including Critical Care):  Procedures   ____________________________________________   INITIAL IMPRESSION / ASSESSMENT AND PLAN / ED COURSE  As part of my medical decision making, I reviewed the following data within the [place] History obtained from family, Nursing notes reviewed and incorporated, Labs reviewed , EKG interpreted , Old EKG reviewed, Old chart reviewed, Patient signed out to oncoming EDP, Radiograph reviewed  and Notes from prior ED visits         Patient here with complaints of chest pain that occurred 2 days ago.  Now having upper back pain.  May be musculoskeletal in nature but not reproducible with palpation.  He declines any medication for pain.  Does have some risk factors for ACS but symptoms seem atypical.  EKG does show some slightly worsened T wave inversions in lateral leads compared to old but no other significant change and no ST elevation.  Differential includes PE, dissection as well.  Will obtain D-dimer.  If dimer negative, low suspicion for PE or dissection.  ED PROGRESS  Patient's D-dimer negative.  First troponin normal.  Chest x-ray clear.  Plan is to obtain second troponin.  If this is negative, will discharge home with close outpatient follow-up.  Patient and wife comfortable with this plan.  I reviewed all nursing notes and pertinent previous records as available.  I have reviewed and interpreted any EKGs, lab and urine results, imaging (as available).  ____________________________________________   FINAL CLINICAL IMPRESSION(S) / ED DIAGNOSES  Final diagnoses:   Chest pain, unspecified type     ED Discharge Orders    None      *Please note:  HAJIME ASFAW was evaluated in Emergency Department on 09/13/2020 for the symptoms described in the history of present illness. He was evaluated in the context of the global COVID-19 pandemic, which necessitated consideration that the patient might be at risk for infection with the SARS-CoV-2 virus that causes COVID-19. Institutional protocols and algorithms that pertain to the evaluation of patients at risk for COVID-19 are in a state of rapid change based on information released by regulatory bodies including the CDC and federal and state organizations. These policies and algorithms were followed during the patient's care in the ED.  Some ED evaluations and interventions may be delayed as a result of limited staffing during and the pandemic.*  Note:  This document was prepared using Dragon voice recognition software and may include unintentional dictation errors.   Jonquil Stubbe, Delice Bison, DO 09/13/20 [phone]

## 2020-09-13 NOTE — ED Triage Notes (Signed)
Pt reports that for the last few days he has had chest discomfort, but this am it began to radiate into his back. Denies SHOB, diaphoresis or N/V.

## 2020-09-13 NOTE — Discharge Instructions (Signed)
Your cardiac labs, chest x-ray, EKG were reassuring today.  I recommend close follow-up with your primary care provider.

## 2020-09-16 ENCOUNTER — Telehealth: Payer: Self-pay

## 2020-09-16 NOTE — Telephone Encounter (Signed)
FYI

## 2020-09-16 NOTE — Telephone Encounter (Signed)
Noted  

## 2020-09-16 NOTE — Telephone Encounter (Signed)
Called pt to schedule appt from ed visit per Jolene no answer left vm

## 2020-09-16 NOTE — Telephone Encounter (Signed)
-----   Message from Venita Lick, NP sent at 09/13/2020  2:01 PM EDT ----- Need ER follow-up please

## 2020-09-16 NOTE — Telephone Encounter (Signed)
Pt called stating that he would like to wait until his appt on 09/30/20 to follow up. Please advise.

## 2020-09-30 ENCOUNTER — Telehealth: Payer: Self-pay | Admitting: Gastroenterology

## 2020-09-30 ENCOUNTER — Ambulatory Visit: Payer: BC Managed Care – PPO | Admitting: Nurse Practitioner

## 2020-09-30 NOTE — Telephone Encounter (Signed)
90 days to [facility]   famotidine (PEPCID) 20 MG tablet Medication Date: 03/26/2020 Department: [facility] [place] Ordering/Authorizing: [name], MD    Order Providers  Prescribing Provider Encounter Provider  [name], MD [name], MD   Outpatient Medication Detail   Disp Refills Start End   famotidine (PEPCID) 20 MG tablet 180 tablet 1 03/26/2020    Sig: TAKE 1 TABLET BY MOUTH TWICE A DAY   Sent to pharmacy as: famotidine (PEPCID) 20 MG tablet   E-Prescribing Status: Receipt confirmed by pharmacy (03/26/2020  9:25 AM EST)    Pharmacy  [id number] - [place], [place] - [address]   Additional Information  Associated Reports  View Encounter  Priority and Order Details

## 2020-10-01 MED ORDER — FAMOTIDINE 20 MG PO TABS: 20.0000 mg | ORAL_TABLET | Freq: Two times a day (BID) | ORAL | 2 refills | Status: DC

## 2020-10-12 IMAGING — CT CT ABDOMEN AND PELVIS WITHOUT AND WITH CONTRAST
3 of 12 series · 11 of 46 positions shown, 17 images · IV contrast (omnipaque)
Comparison: No priors.

CLINICAL DATA: 63-year-old male with history of microscopic
hematuria and pyuria.

EXAM:
CT ABDOMEN AND PELVIS WITHOUT AND WITH CONTRAST
TECHNIQUE: Multidetector CT imaging of the abdomen and pelvis was performed
following the standard protocol before and following the bolus
administration of intravenous contrast.
CONTRAST:  125mL OMNIPAQUE IOHEXOL 300 MG/ML  SOLN

[Series 2: axial without pre · axial · non-contrast · 0.87mm/px · z∈[-1509,-1369]mm · 3 of 100 slices shown]
[im 15/100  soft-tissue]
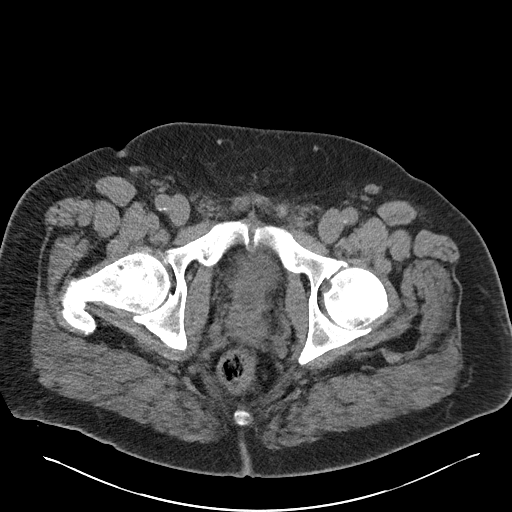
[im 29/100  soft-tissue]
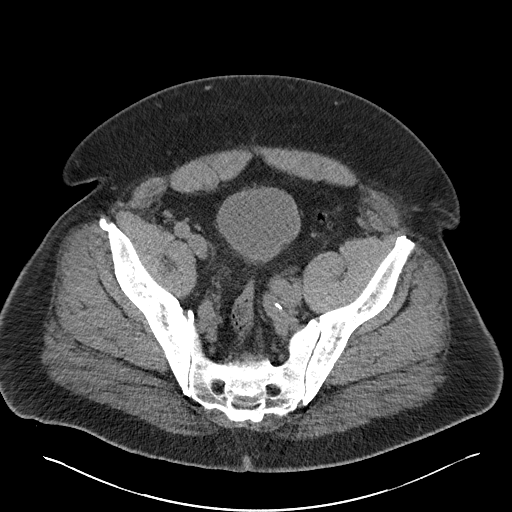
[im 43/100  soft-tissue]
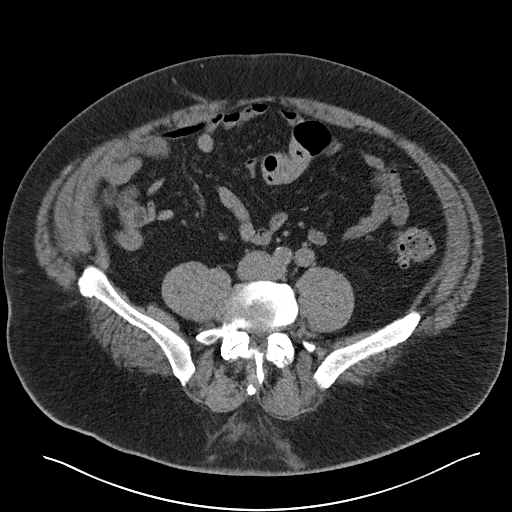

[Series 5: coronal without pre · coronal · non-contrast · 0.87mm/px · 2 of 192 slices shown, 3 images]
[im 64/192  soft-tissue]
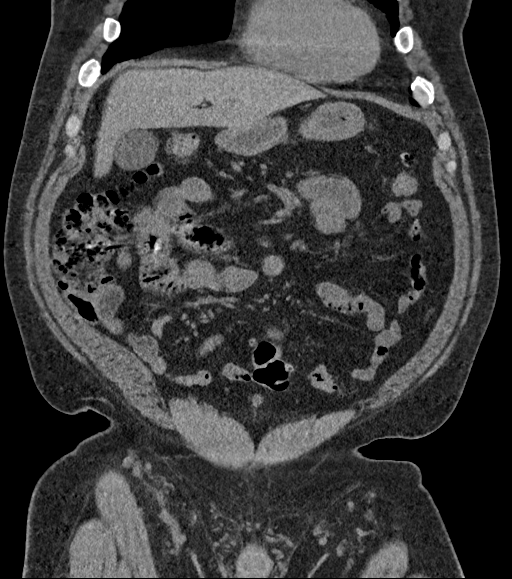
[im 64/192  bone]
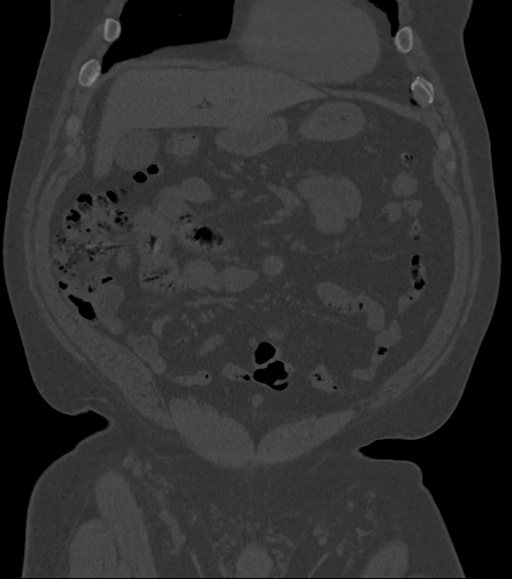
[im 128/192  soft-tissue]
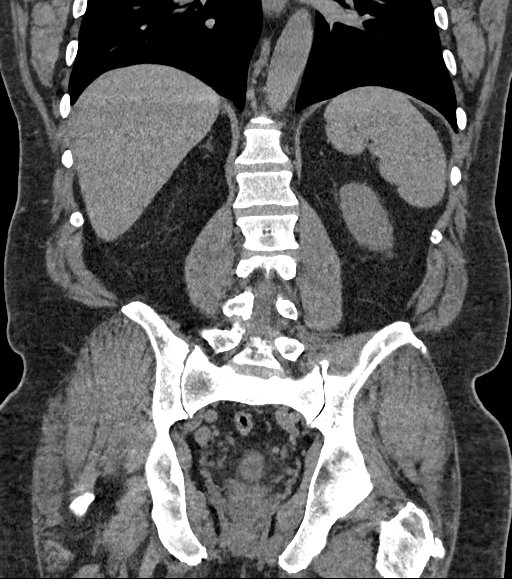

[Series 17: axial delay prone · axial · delayed · 0.87mm/px · z∈[-1549,-1164]mm · 6 of 109 slices shown, 11 images]
[im 16/109  soft-tissue]
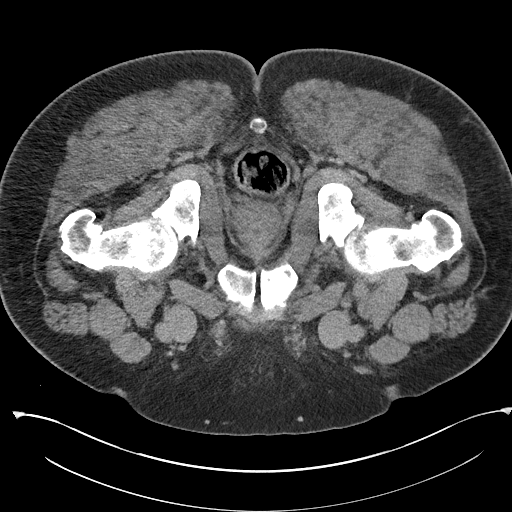
[im 16/109  bone]
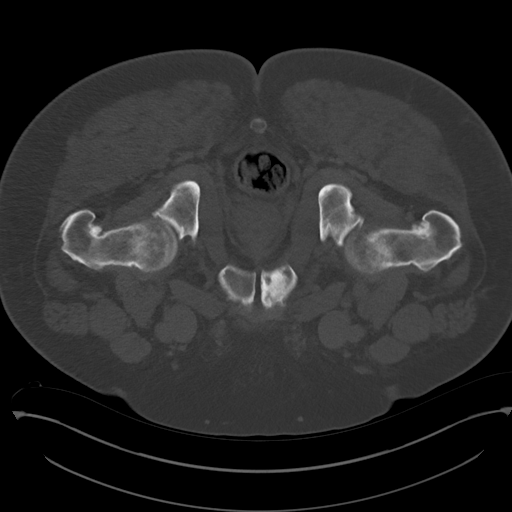
[im 31/109  soft-tissue]
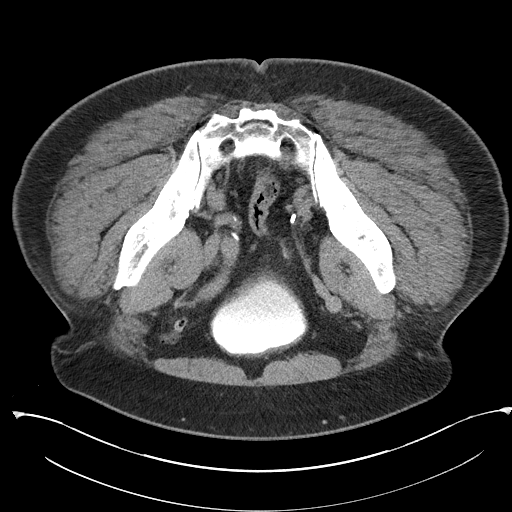
[im 47/109  soft-tissue]
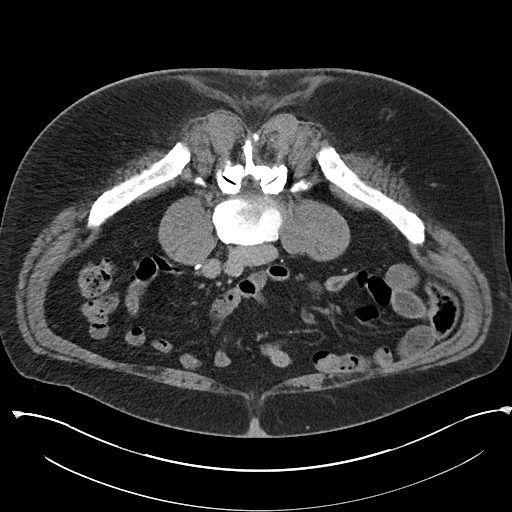
[im 47/109  lung]
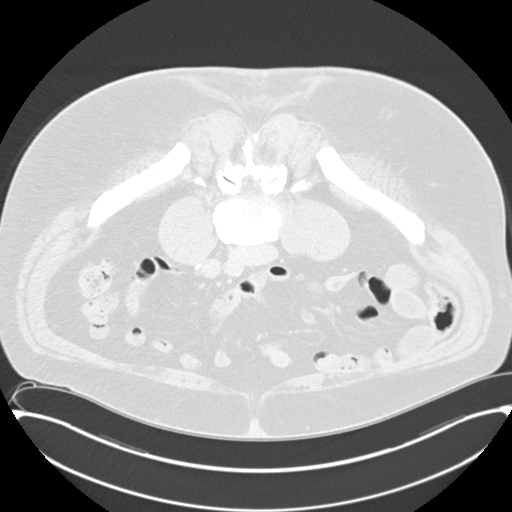
[im 62/109  soft-tissue]
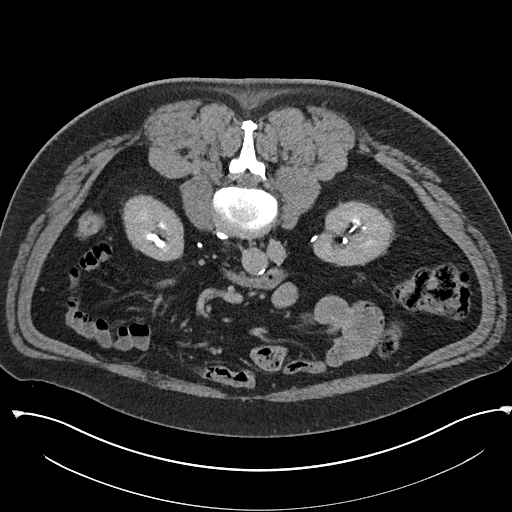
[im 62/109  lung]
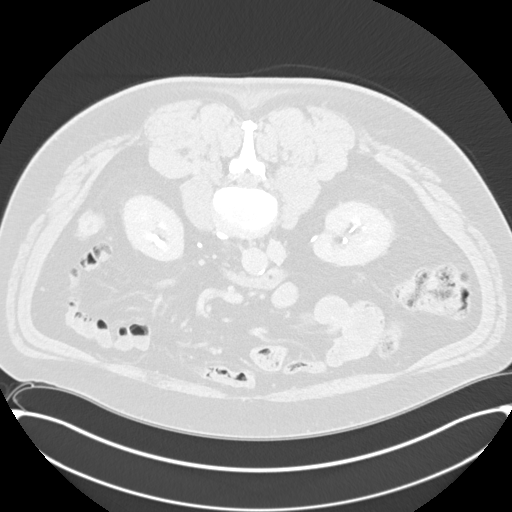
[im 78/109  soft-tissue]
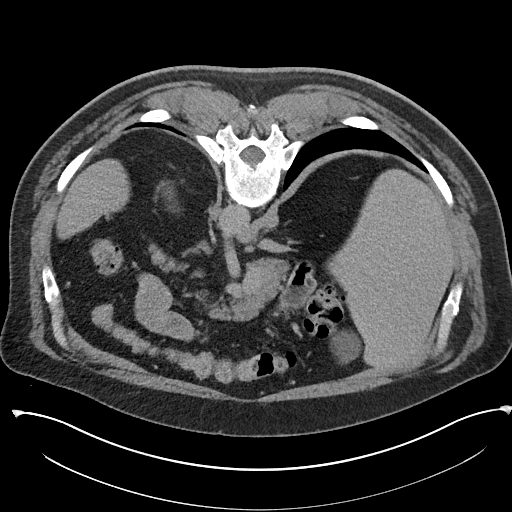
[im 78/109  lung]
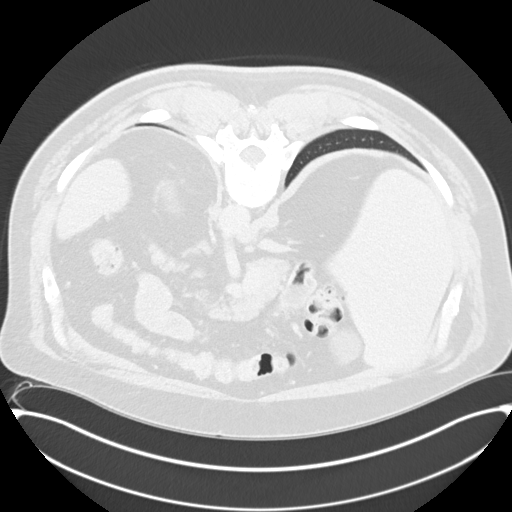
[im 93/109  soft-tissue]
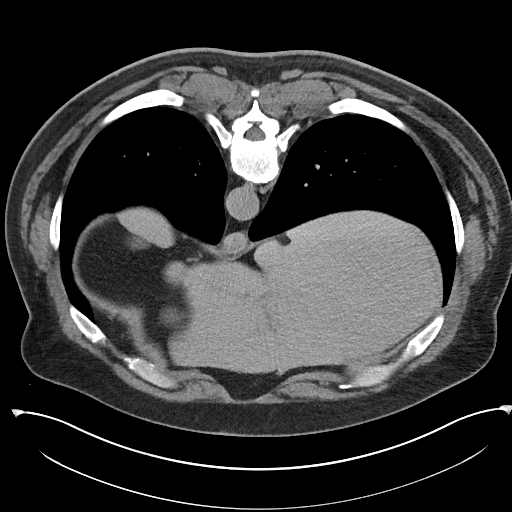
[im 93/109  lung]
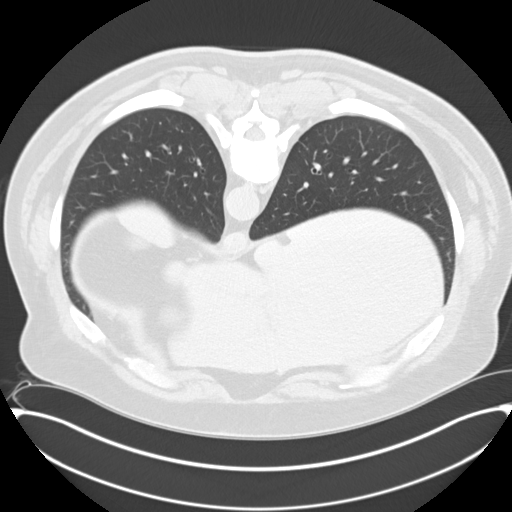

[11 of 46 positions shown; findings below may reference images not displayed]

FINDINGS: Lower chest: Small calcified granuloma in the right lower lobe.
Atherosclerotic calcifications in the descending thoracic aorta as
well as the right coronary artery.

Hepatobiliary: Subcentimeter low-attenuation lesions in segments 2
and 7 of the liver, too small to characterize, but statistically
likely to represent tiny cysts. No larger more suspicious appearing
hepatic lesions. No intra or extrahepatic biliary ductal dilatation.
Gallbladder is normal in appearance.

Pancreas: No pancreatic mass. No pancreatic ductal dilatation. No
pancreatic or peripancreatic fluid collections or inflammatory
changes.

Spleen: Unremarkable.

Adrenals/Urinary Tract: 3 small nonobstructive calculi are noted
within the lower pole collecting system of the left kidney measuring
6 mm. No additional calculi are noted in the right renal collecting
system, along the course of either ureter, or within the lumen of
the urinary bladder. No hydroureteronephrosis. Postcontrast images
demonstrate no suspicious renal lesions. On postcontrast delayed
images there are no filling defects within the collecting system of
either kidney, along the course of either ureter or within the lumen
of the urinary bladder to suggest the presence of urothelial
neoplasm. Large right bladder diverticulum measuring 6.1 x 3.4 x
cm. Urinary bladder is otherwise unremarkable in appearance.
Bilateral adrenal glands are normal in appearance.

Stomach/Bowel: Normal appearance of the stomach. No pathologic
dilatation of small bowel or colon. Numerous colonic diverticulae
are noted, without surrounding inflammatory changes to suggest an
acute diverticulitis at this time. Normal appendix.

Vascular/Lymphatic: Aortic atherosclerosis, without evidence of
aneurysm or dissection in the abdominal or pelvic vasculature. No
lymphadenopathy noted in the abdomen or pelvis.

Reproductive: Prostate gland and seminal vesicles are unremarkable
in appearance.

Other: No significant volume of ascites.  No pneumoperitoneum.

Musculoskeletal: Small sclerotic lesion in the left femoral head
measuring 1.2 x 0.7 cm (axial image 86 of series 9) with narrow zone
of transition, favored to represent a bone island. Sclerotic lesion
in the right ilium with slightly ill-defined margins measuring 1.9 x
1.2 cm (axial image 74 of series 9). Larger sclerotic lesion in the
parasymphyseal region of the right pubic bone (axial image 90 of
series 9) measuring 3.3 x 2.5 cm.
IMPRESSION: 1. Three nonobstructive calculi are noted within the lower pole
collecting system of left kidney measuring up to 6 mm. No ureteral
stones or bladder stones. No hydroureteronephrosis.
2. Large right bladder diverticulum. No definite signs of urothelial
neoplasm confidently identified on today's examination.
3. Multiple sclerotic osseous lesions are noted. Although the
smallest of these lesions in the left femoral head may represent a
bone island, the other 2 lesions in the right hemipelvis are
indeterminate. Further evaluation with bone scan should be
considered to exclude the possibility of metastatic disease.
4. Aortic atherosclerosis, in addition to at least right coronary
artery disease. Please note that although the presence of coronary
artery calcium documents the presence of coronary artery disease,
the severity of this disease and any potential stenosis cannot be
assessed on this non-gated CT examination. Assessment for potential
risk factor modification, dietary therapy or pharmacologic therapy
may be warranted, if clinically indicated.

## 2020-10-21 ENCOUNTER — Ambulatory Visit: Payer: BC Managed Care – PPO | Admitting: Nurse Practitioner

## 2020-10-25 ENCOUNTER — Ambulatory Visit (INDEPENDENT_AMBULATORY_CARE_PROVIDER_SITE_OTHER): Payer: BC Managed Care – PPO | Admitting: Nurse Practitioner

## 2020-10-25 ENCOUNTER — Other Ambulatory Visit: Payer: Self-pay

## 2020-10-25 ENCOUNTER — Encounter: Payer: Self-pay | Admitting: Nurse Practitioner

## 2020-10-25 VITALS — BP 136/78 | HR 70 | Temp 98.5°F | Ht 72.5 in | Wt 357.6 lb

## 2020-10-25 DIAGNOSIS — E7800 Pure hypercholesterolemia, unspecified: Secondary | ICD-10-CM

## 2020-10-25 DIAGNOSIS — K219 Gastro-esophageal reflux disease without esophagitis: Secondary | ICD-10-CM | POA: Diagnosis not present

## 2020-10-25 DIAGNOSIS — Z6841 Body Mass Index (BMI) 40.0 and over, adult: Secondary | ICD-10-CM

## 2020-10-25 DIAGNOSIS — N400 Enlarged prostate without lower urinary tract symptoms: Secondary | ICD-10-CM | POA: Diagnosis not present

## 2020-10-25 DIAGNOSIS — Z87448 Personal history of other diseases of urinary system: Secondary | ICD-10-CM

## 2020-10-25 DIAGNOSIS — I700 Atherosclerosis of aorta: Secondary | ICD-10-CM

## 2020-10-25 DIAGNOSIS — Z23 Encounter for immunization: Secondary | ICD-10-CM | POA: Diagnosis not present

## 2020-10-25 DIAGNOSIS — R030 Elevated blood-pressure reading, without diagnosis of hypertension: Secondary | ICD-10-CM | POA: Diagnosis not present

## 2020-10-25 DIAGNOSIS — Z0000 Encounter for general adult medical examination without abnormal findings: Secondary | ICD-10-CM | POA: Diagnosis not present

## 2020-10-25 MED ORDER — SHINGRIX 50 MCG/0.5ML IM SUSR
0.5000 mL | Freq: Once | INTRAMUSCULAR | 0 refills | Status: AC
Start: 2020-10-25 — End: 2020-10-25

## 2020-10-25 MED ORDER — TIZANIDINE HCL 2 MG PO TABS: ORAL_TABLET | ORAL | 4 refills | Status: DC

## 2020-10-25 MED ORDER — NYSTATIN 100000 UNIT/GM EX CREA: 1.0000 "application " | TOPICAL_CREAM | Freq: Two times a day (BID) | CUTANEOUS | 5 refills | Status: AC | PRN

## 2020-10-25 MED ORDER — SHINGRIX 50 MCG/0.5ML IM SUSR: 0.5000 mL | Freq: Once | INTRAMUSCULAR | 0 refills | Status: AC

## 2020-10-25 MED ORDER — ATORVASTATIN CALCIUM 20 MG PO TABS: 20.0000 mg | ORAL_TABLET | Freq: Every day | ORAL | 4 refills | Status: AC

## 2020-10-25 NOTE — Progress Notes (Signed)
BP 136/78 (BP Location: Left Arm)   Pulse 70   Temp 98.5 F (36.9 C) (Oral)   Ht 6' 0.5" (1.842 m)   Wt (!) 357 lb 9.6 oz (162.2 kg)   SpO2 94%   BMI 47.83 kg/m    Subjective:    Patient ID: Jordan Gaines, male    DOB: 11-24-1955, 65 y.o.   MRN: [id number]  HPI: Jordan Gaines is a 65 y.o. male presenting on 10/25/2020 for comprehensive medical examination. Current medical complaints include:none  He currently lives with: significant other Interim Problems from his last visit: no   He is moving to near [place] and will not be returning to PCP office.:(  Currently being followed by urology for history of hematuria -- which has resolved since procedure last year, urethral stricture.  Last saw urology on 09/05/20, to return in one year.  He denies any further blood in urine.  Continues on Tamsulosin.  HYPERLIPIDEMIA Continues on Atorvastatin 20 MG daily + ASA. Hyperlipidemia status: good compliance Satisfied with current treatment?  yes Side effects:  no Medication compliance: good compliance Past cholesterol meds: none Supplements: none Aspirin:  yes The 10-year ASCVD risk score Mikey Bussing DC Jr., et al., 2013) is: 17.8%   Values used to calculate the score:     Age: 37 years     Sex: Male     Is Non-Hispanic African American: No     Diabetic: No     Tobacco smoker: No     Systolic Blood Pressure: 009 mmHg     Is BP treated: No     HDL Cholesterol: 31 mg/dL     Total Cholesterol: 156 mg/dL Chest pain:  no Coronary artery disease:  no Family history CAD:  no Family history early CAD:  no   GERD Continues on Pepcid. GERD control status: stable  Satisfied with current treatment? yes Heartburn frequency:  Medication side effects: no  Medication compliance: stable Dysphagia: no Odynophagia:  no Hematemesis: no Blood in stool: no EGD: no  Functional Status Survey: Is the patient deaf or have difficulty hearing?: No Does the patient have difficulty seeing, even when  wearing glasses/contacts?: No Does the patient have difficulty concentrating, remembering, or making decisions?: No Does the patient have difficulty walking or climbing stairs?: No Does the patient have difficulty dressing or bathing?: No Does the patient have difficulty doing errands alone such as visiting a doctor's office or shopping?: No  FALL RISK: Fall Risk  04/11/2020 10/11/2019 09/16/2018  Falls in the past year? 0 0 0  Number falls in past yr: - 0 0  Injury with Fall? - 0 0  Follow up - Falls evaluation completed Falls evaluation completed    Depression Screen Depression screen [facility] 2/9 10/25/2020 10/11/2019 09/19/2018 09/16/2018  Decreased Interest 0 0 0 0  Down, Depressed, Hopeless 0 0 0 0  PHQ - 2 Score 0 0 0 0  Altered sleeping - - 1 -  Tired, decreased energy - - 0 -  Change in appetite - - 0 -  Feeling bad or failure about yourself  - - 0 -  Trouble concentrating - - 0 -  Moving slowly or fidgety/restless - - 0 -  Suicidal thoughts - - 0 -  PHQ-9 Score - - 1 -  Difficult doing work/chores - - Not difficult at all -    Advanced Directives <no information>  Past Medical History:  Past Medical History:  Diagnosis Date   GERD (gastroesophageal  reflux disease)    Kidney stones     Surgical History:  Past Surgical History:  Procedure Laterality Date   BLADDER SURGERY     COLONOSCOPY WITH PROPOFOL N/A 10/28/2018   Procedure: COLONOSCOPY WITH PROPOFOL;  Surgeon: [name], MD;  Location: [facility] ENDOSCOPY;  Service: Gastroenterology;  Laterality: N/A;   CYSTOSCOPY WITH BIOPSY N/A 12/14/2018   Procedure: CYSTOSCOPY WITH BIOPSY;  Surgeon: Abbie Sons, MD;  Location: [facility] ORS;  Service: Urology;  Laterality: N/A;   CYSTOSCOPY WITH URETHRAL DILATATION N/A 12/14/2018   Procedure: CYSTOSCOPY WITH URETHRAL DILATATION;  Surgeon: Abbie Sons, MD;  Location: [facility] ORS;  Service: Urology;  Laterality: N/A;   TONSILLECTOMY      Medications:  Current Outpatient Medications on  File Prior to Visit  Medication Sig   ASPIRIN 81 PO Take 1 tablet by mouth daily.   famotidine (PEPCID) 20 MG tablet Take 1 tablet (20 mg total) by mouth 2 (two) times daily.   meloxicam (MOBIC) 15 MG tablet Take 15 mg by mouth daily.   sildenafil (REVATIO) 20 MG tablet Take 2-5 tablets 1 hour prior to intercourse.   tamsulosin (FLOMAX) 0.4 MG CAPS capsule Take 1 capsule (0.4 mg total) by mouth daily.   vitamin B-12 (CYANOCOBALAMIN) 500 MCG tablet Take 500 mcg by mouth daily.   No current facility-administered medications on file prior to visit.    Allergies:  No Known Allergies  Social History:  Social History   Socioeconomic History   Marital status: Significant Other    Spouse name: Not on file   Number of children: Not on file   Years of education: Not on file   Highest education level: Not on file  Occupational History   Occupation: Armed forces logistics/support/administrative officer    Comment: [employer]  Tobacco Use   Smoking status: Never   Smokeless tobacco: Never  Vaping Use   Vaping Use: Never used  Substance and Sexual Activity   Alcohol use: Yes    Comment: rarely   Drug use: Never   Sexual activity: Yes  Other Topics Concern   Not on file  Social History Narrative   Not on file   Social Determinants of Health   Financial Resource Strain: Not on file  Food Insecurity: Not on file  Transportation Needs: Not on file  Physical Activity: Not on file  Stress: Not on file  Social Connections: Not on file  Intimate Partner Violence: Not on file   Social History   Tobacco Use  Smoking Status Never  Smokeless Tobacco Never   Social History   Substance and Sexual Activity  Alcohol Use Yes   Comment: rarely    Family History:  Family History  Problem Relation Age of Onset   Diabetes Mother    Heart disease Mother    Diabetes Father    Other Sister        Covid Complications   Multiple sclerosis Son    Heart disease Maternal Grandfather     Past medical history, surgical  history, medications, allergies, family history and social history reviewed with patient today and changes made to appropriate areas of the chart.   Review of Systems - negative All other ROS negative except what is listed above and in the HPI.      Objective:    BP 136/78 (BP Location: Left Arm)   Pulse 70   Temp 98.5 F (36.9 C) (Oral)   Ht 6' 0.5" (1.842 m)   Wt (!) 357  lb 9.6 oz (162.2 kg)   SpO2 94%   BMI 47.83 kg/m   Wt Readings from Last 3 Encounters:  10/25/20 (!) 357 lb 9.6 oz (162.2 kg)  09/13/20 (!) 350 lb (158.8 kg)  09/05/20 (!) 350 lb (158.8 kg)    Physical Exam Vitals and nursing note reviewed.  Constitutional:      General: He is awake. He is not in acute distress.    Appearance: He is well-developed. He is morbidly obese. He is not ill-appearing.  HENT:     Head: Normocephalic and atraumatic.     Right Ear: Hearing, tympanic membrane, ear canal and external ear normal. No drainage.     Left Ear: Hearing, tympanic membrane, ear canal and external ear normal. No drainage.     Nose: Nose normal.     Mouth/Throat:     Pharynx: Uvula midline.  Eyes:     General: Lids are normal.        Right eye: No discharge.        Left eye: No discharge.     Extraocular Movements: Extraocular movements intact.     Conjunctiva/sclera: Conjunctivae normal.     Pupils: Pupils are equal, round, and reactive to light.     Visual Fields: Right eye visual fields normal and left eye visual fields normal.  Neck:     Thyroid: No thyromegaly.     Vascular: No carotid bruit or JVD.     Trachea: Trachea normal.  Cardiovascular:     Rate and Rhythm: Normal rate and regular rhythm.     Heart sounds: Normal heart sounds, S1 normal and S2 normal. No murmur heard.   No gallop.  Pulmonary:     Effort: Pulmonary effort is normal. No accessory muscle usage or respiratory distress.     Breath sounds: Normal breath sounds.  Abdominal:     General: Bowel sounds are normal.      Palpations: Abdomen is soft. There is no hepatomegaly or splenomegaly.     Tenderness: There is no abdominal tenderness.  Musculoskeletal:        General: Normal range of motion.     Cervical back: Normal range of motion and neck supple.     Right lower leg: No edema.     Left lower leg: No edema.  Lymphadenopathy:     Head:     Right side of head: No submental, submandibular, tonsillar, preauricular or posterior auricular adenopathy.     Left side of head: No submental, submandibular, tonsillar, preauricular or posterior auricular adenopathy.     Cervical: No cervical adenopathy.  Skin:    General: Skin is warm and dry.     Capillary Refill: Capillary refill takes less than 2 seconds.     Findings: No rash.  Neurological:     Mental Status: He is alert and oriented to person, place, and time.     Cranial Nerves: Cranial nerves are intact.     Gait: Gait is intact.     Deep Tendon Reflexes: Reflexes are normal and symmetric.     Reflex Scores:      Brachioradialis reflexes are 2+ on the right side and 2+ on the left side.      Patellar reflexes are 2+ on the right side and 2+ on the left side. Psychiatric:        Attention and Perception: Attention normal.        Mood and Affect: Mood normal.  Speech: Speech normal.        Behavior: Behavior normal. Behavior is cooperative.        Thought Content: Thought content normal.        Cognition and Memory: Cognition normal.        Judgment: Judgment normal.   Results for orders placed or performed during the hospital encounter of 09/13/20  CBC  Result Value Ref Range   WBC 8.1 4.0 - 10.5 K/uL   RBC 4.71 4.22 - 5.81 MIL/uL   Hemoglobin 14.0 13.0 - 17.0 g/dL   HCT 41.8 39.0 - 52.0 %   MCV 88.7 80.0 - 100.0 fL   MCH 29.7 26.0 - 34.0 pg   MCHC 33.5 30.0 - 36.0 g/dL   RDW 12.7 11.5 - 15.5 %   Platelets 207 150 - 400 K/uL   nRBC 0.0 0.0 - 0.2 %  Basic metabolic panel  Result Value Ref Range   Sodium 140 135 - 145 mmol/L    Potassium 4.3 3.5 - 5.1 mmol/L   Chloride 105 98 - 111 mmol/L   CO2 26 22 - 32 mmol/L   Glucose, Bld 114 (H) 70 - 99 mg/dL   BUN 19 8 - 23 mg/dL   Creatinine, Ser 1.04 0.61 - 1.24 mg/dL   Calcium 8.7 (L) 8.9 - 10.3 mg/dL   GFR, Estimated >60 >60 mL/min   Anion gap 9 5 - 15  D-dimer, quantitative  Result Value Ref Range   D-Dimer, Quant 0.39 0.00 - 0.50 ug/mL-FEU  Troponin I (High Sensitivity)  Result Value Ref Range   Troponin I (High Sensitivity) 17 <18 ng/L  Troponin I (High Sensitivity)  Result Value Ref Range   Troponin I (High Sensitivity) 17 <18 ng/L      Assessment & Plan:   Problem List Items Addressed This Visit       Cardiovascular and Mediastinum   Aortic atherosclerosis ([place])    Noted on August 2020 CT, recommend continued use of statin and monitor for control of cholesterol + recommend continues daily Baby ASA 81 MG for prevention.         Relevant Medications   atorvastatin (LIPITOR) 20 MG tablet     Digestive   Gastroesophageal reflux disease without esophagitis    Ongoing, recommend daily Pepcid at this time.  Stable with good control.  Continue current regimen and adjust as needed.  Check Mag level annually.       Relevant Orders   Magnesium     Other   Obesity - Primary    BMI 47.83.  Recommended eating smaller high protein, low fat meals more frequently and exercising 30 mins a day 5 times a week with a goal of 10-15lb weight loss in the next 3 months. Patient voiced their understanding and motivation to adhere to these recommendations.        Pure hypercholesterolemia    Ongoing, recommend continued use of statin.  Educated him on benefit.  Will adjust dose as needed.  Obtain lipid panel today,non fasting.       Relevant Medications   atorvastatin (LIPITOR) 20 MG tablet   Other Relevant Orders   Lipid Panel w/o Chol/HDL Ratio   History of urethral stricture    Continue collaboration with urology, has improved since procedure last  year.       Elevated BP without diagnosis of hypertension    Ongoing, suspect white coat.  Initial elevated, but repeat at goal.  Recommend he monitor BP at least  a few mornings a week at home and document.  DASH diet at home.  Continue current medication regimen and adjust as needed.  Labs today: CBC, CMP, TSH.  Moving to [place], no follow-up needed.          Relevant Orders   CBC with Differential/Platelet   Comprehensive metabolic panel   TSH   Other Visit Diagnoses     Benign prostatic hyperplasia without lower urinary tract symptoms       Check PSA today, no symptoms.   Relevant Orders   PSA   Need for shingles vaccine       Shingrix ordered   Relevant Medications   Zoster Vaccine Adjuvanted [facility]) injection   Zoster Vaccine Adjuvanted [facility]) injection (Start on 04/23/2021)   Annual physical exam       Annual physical today with labs.  Health maintenance reviewed.       Discussed aspirin prophylaxis for myocardial infarction prevention and decision was made to continue ASA  LABORATORY TESTING:  Health maintenance labs ordered today as discussed above.   The natural history of prostate cancer and ongoing controversy regarding screening and potential treatment outcomes of prostate cancer has been discussed with the patient. The meaning of a false positive PSA and a false negative PSA has been discussed. He indicates understanding of the limitations of this screening test and wishes to proceed with screening PSA testing.   IMMUNIZATIONS:   - Tdap: Tetanus vaccination status reviewed: last tetanus booster within 10 years. - Influenza: Up to date - Pneumovax: Up to date - Prevnar: Up to date - Zostavax vaccine: ordered to pharmacy  SCREENING: - Colonoscopy: Up to date  Discussed with patient purpose of the colonoscopy is to detect colon cancer at curable precancerous or early stages   - AAA Screening: Not applicable  -Hearing Test: Not applicable   -Spirometry: Not applicable   PATIENT COUNSELING:    Sexuality: Discussed sexually transmitted diseases, partner selection, use of condoms, avoidance of unintended pregnancy  and contraceptive alternatives.   Advised to avoid cigarette smoking.  I discussed with the patient that most people either abstain from alcohol or drink within safe limits (<=14/week and <=4 drinks/occasion for males, <=7/weeks and <= 3 drinks/occasion for females) and that the risk for alcohol disorders and other health effects rises proportionally with the number of drinks per week and how often a drinker exceeds daily limits.  Discussed cessation/primary prevention of drug use and availability of treatment for abuse.   Diet: Encouraged to adjust caloric intake to maintain  or achieve ideal body weight, to reduce intake of dietary saturated fat and total fat, to limit sodium intake by avoiding high sodium foods and not adding table salt, and to maintain adequate dietary potassium and calcium preferably from fresh fruits, vegetables, and low-fat dairy products.    Stressed the importance of regular exercise  Injury prevention: Discussed safety belts, safety helmets, smoke detector, smoking near bedding or upholstery.   Dental health: Discussed importance of regular tooth brushing, flossing, and dental visits.   Follow up plan: NEXT PREVENTATIVE PHYSICAL DUE IN 1 YEAR. Return if symptoms worsen or fail to improve.

## 2020-10-25 NOTE — Assessment & Plan Note (Signed)
BMI 47.83.  Recommended eating smaller high protein, low fat meals more frequently and exercising 30 mins a day 5 times a week with a goal of 10-15lb weight loss in the next 3 months. Patient voiced their understanding and motivation to adhere to these recommendations.

## 2020-10-25 NOTE — Assessment & Plan Note (Signed)
Noted on August 2020 CT, recommend continued use of statin and monitor for control of cholesterol + recommend continues daily Baby ASA 81 MG for prevention.

## 2020-10-25 NOTE — Assessment & Plan Note (Signed)
Ongoing, recommend daily Pepcid at this time.  Stable with good control.  Continue current regimen and adjust as needed.  Check Mag level annually.

## 2020-10-25 NOTE — Patient Instructions (Signed)
https://www.nhlbi.nih.gov/files/docs/public/heart/dash_brief.pdf">  DASH Eating Plan DASH stands for Dietary Approaches to Stop Hypertension. The DASH eating plan is a healthy eating plan that has been shown to: Reduce high blood pressure (hypertension). Reduce your risk for type 2 diabetes, heart disease, and stroke. Help with weight loss. What are tips for following this plan? Reading food labels Check food labels for the amount of salt (sodium) per serving. Choose foods with less than 5 percent of the Daily Value of sodium. Generally, foods with less than 300 milligrams (mg) of sodium per serving fit into this eating plan. To find whole grains, look for the word "whole" as the first word in the ingredient list. Shopping Buy products labeled as "low-sodium" or "no salt added." Buy fresh foods. Avoid canned foods and pre-made or frozen meals. Cooking Avoid adding salt when cooking. Use salt-free seasonings or herbs instead of table salt or sea salt. Check with your health care provider or pharmacist before using salt substitutes. Do not fry foods. Cook foods using healthy methods such as baking, boiling, grilling, roasting, and broiling instead. Cook with heart-healthy oils, such as olive, canola, avocado, soybean, or sunflower oil. Meal planning  Eat a balanced diet that includes: 4 or more servings of fruits and 4 or more servings of vegetables each day. Try to fill one-half of your plate with fruits and vegetables. 6-8 servings of whole grains each day. Less than 6 oz (170 g) of lean meat, poultry, or fish each day. A 3-oz (85-g) serving of meat is about the same size as a deck of cards. One egg equals 1 oz (28 g). 2-3 servings of low-fat dairy each day. One serving is 1 cup (237 mL). 1 serving of nuts, seeds, or beans 5 times each week. 2-3 servings of heart-healthy fats. Healthy fats called omega-3 fatty acids are found in foods such as walnuts, flaxseeds, fortified milks, and eggs.  These fats are also found in cold-water fish, such as sardines, salmon, and mackerel. Limit how much you eat of: Canned or prepackaged foods. Food that is high in trans fat, such as some fried foods. Food that is high in saturated fat, such as fatty meat. Desserts and other sweets, sugary drinks, and other foods with added sugar. Full-fat dairy products. Do not salt foods before eating. Do not eat more than 4 egg yolks a week. Try to eat at least 2 vegetarian meals a week. Eat more home-cooked food and less restaurant, buffet, and fast food.  Lifestyle When eating at a restaurant, ask that your food be prepared with less salt or no salt, if possible. If you drink alcohol: Limit how much you use to: 0-1 drink a day for women who are not pregnant. 0-2 drinks a day for men. Be aware of how much alcohol is in your drink. In the [place] one drink equals one 12 oz bottle of beer (355 mL), one 5 oz glass of wine (148 mL), or one 1 oz glass of hard liquor (44 mL). General information Avoid eating more than 2,300 mg of salt a day. If you have hypertension, you may need to reduce your sodium intake to 1,500 mg a day. Work with your health care provider to maintain a healthy body weight or to lose weight. Ask what an ideal weight is for you. Get at least 30 minutes of exercise that causes your heart to beat faster (aerobic exercise) most days of the week. Activities may include walking, swimming, or biking. Work with your health care provider   or dietitian to adjust your eating plan to your individual calorie needs. What foods should I eat? Fruits All fresh, dried, or frozen fruit. Canned fruit in natural juice (without addedsugar). Vegetables Fresh or frozen vegetables (raw, steamed, roasted, or grilled). Low-sodium or reduced-sodium tomato and vegetable juice. Low-sodium or reduced-sodium tomatosauce and tomato paste. Low-sodium or reduced-sodium canned vegetables. Grains Whole-grain or  whole-wheat bread. Whole-grain or whole-wheat pasta. Brown rice. Oatmeal. Quinoa. Bulgur. Whole-grain and low-sodium cereals. Pita bread.Low-fat, low-sodium crackers. Whole-wheat flour tortillas. Meats and other proteins Skinless chicken or turkey. Ground chicken or turkey. Pork with fat trimmed off. Fish and seafood. Egg whites. Dried beans, peas, or lentils. Unsalted nuts, nut butters, and seeds. Unsalted canned beans. Lean cuts of beef with fat trimmed off. Low-sodium, lean precooked or cured meat, such as sausages or meatloaves. Dairy Low-fat (1%) or fat-free (skim) milk. Reduced-fat, low-fat, or fat-free cheeses. Nonfat, low-sodium ricotta or cottage cheese. Low-fat or nonfatyogurt. Low-fat, low-sodium cheese. Fats and oils Soft margarine without trans fats. Vegetable oil. Reduced-fat, low-fat, or light mayonnaise and salad dressings (reduced-sodium). Canola, safflower, olive, avocado, soybean, andsunflower oils. Avocado. Seasonings and condiments Herbs. Spices. Seasoning mixes without salt. Other foods Unsalted popcorn and pretzels. Fat-free sweets. The items listed above may not be a complete list of foods and beverages you can eat. Contact a dietitian for more information. What foods should I avoid? Fruits Canned fruit in a light or heavy syrup. Fried fruit. Fruit in cream or buttersauce. Vegetables Creamed or fried vegetables. Vegetables in a cheese sauce. Regular canned vegetables (not low-sodium or reduced-sodium). Regular canned tomato sauce and paste (not low-sodium or reduced-sodium). Regular tomato and vegetable juice(not low-sodium or reduced-sodium). Pickles. Olives. Grains Baked goods made with fat, such as croissants, muffins, or some breads. Drypasta or rice meal packs. Meats and other proteins Fatty cuts of meat. Ribs. Fried meat. Bacon. Bologna, salami, and other precooked or cured meats, such as sausages or meat loaves. Fat from the back of a pig (fatback). Bratwurst.  Salted nuts and seeds. Canned beans with added salt. Canned orsmoked fish. Whole eggs or egg yolks. Chicken or turkey with skin. Dairy Whole or 2% milk, cream, and half-and-half. Whole or full-fat cream cheese. Whole-fat or sweetened yogurt. Full-fat cheese. Nondairy creamers. Whippedtoppings. Processed cheese and cheese spreads. Fats and oils Butter. Stick margarine. Lard. Shortening. Ghee. Bacon fat. Tropical oils, suchas coconut, palm kernel, or palm oil. Seasonings and condiments Onion salt, garlic salt, seasoned salt, table salt, and sea salt. Worcestershire sauce. Tartar sauce. Barbecue sauce. Teriyaki sauce. Soy sauce, including reduced-sodium. Steak sauce. Canned and packaged gravies. Fish sauce. Oyster sauce. Cocktail sauce. Store-bought horseradish. Ketchup. Mustard. Meat flavorings and tenderizers. Bouillon cubes. Hot sauces. Pre-made or packaged marinades. Pre-made or packaged taco seasonings. Relishes. Regular saladdressings. Other foods Salted popcorn and pretzels. The items listed above may not be a complete list of foods and beverages you should avoid. Contact a dietitian for more information. Where to find more information National Heart, Lung, and Blood Institute: www.nhlbi.nih.gov American Heart Association: www.heart.org Academy of Nutrition and Dietetics: www.eatright.org National Kidney Foundation: www.kidney.org Summary The DASH eating plan is a healthy eating plan that has been shown to reduce high blood pressure (hypertension). It may also reduce your risk for type 2 diabetes, heart disease, and stroke. When on the DASH eating plan, aim to eat more fresh fruits and vegetables, whole grains, lean proteins, low-fat dairy, and heart-healthy fats. With the DASH eating plan, you should limit salt (sodium) intake to 2,300   mg a day. If you have hypertension, you may need to reduce your sodium intake to 1,500 mg a day. Work with your health care provider or dietitian to adjust  your eating plan to your individual calorie needs. This information is not intended to replace advice given to you by your health care provider. Make sure you discuss any questions you have with your healthcare provider. Document Revised: 03/03/2019 Document Reviewed: 03/03/2019 Elsevier Patient Education  2022 Elsevier Inc.  

## 2020-10-25 NOTE — Assessment & Plan Note (Signed)
Continue collaboration with urology, has improved since procedure last year.

## 2020-10-25 NOTE — Assessment & Plan Note (Signed)
Ongoing, recommend continued use of statin.  Educated him on benefit.  Will adjust dose as needed.  Obtain lipid panel today,non fasting.

## 2020-10-25 NOTE — Assessment & Plan Note (Signed)
Ongoing, suspect white coat.  Initial elevated, but repeat at goal.  Recommend he monitor BP at least a few mornings a week at home and document.  DASH diet at home.  Continue current medication regimen and adjust as needed.  Labs today: CBC, CMP, TSH.  Moving to [place], no follow-up needed.

## 2020-10-26 LAB — CBC WITH DIFFERENTIAL/PLATELET
Basophils Absolute: 0.1 10*3/uL (ref 0.0–0.2)
Basos: 1 %
EOS (ABSOLUTE): 0.3 10*3/uL (ref 0.0–0.4)
Eos: 3 %
Hematocrit: 40.7 % (ref 37.5–51.0)
Hemoglobin: 14 g/dL (ref 13.0–17.7)
Immature Grans (Abs): 0 10*3/uL (ref 0.0–0.1)
Immature Granulocytes: 0 %
Lymphocytes Absolute: 1.7 10*3/uL (ref 0.7–3.1)
Lymphs: 20 %
MCH: 30.2 pg (ref 26.6–33.0)
MCHC: 34.4 g/dL (ref 31.5–35.7)
MCV: 88 fL (ref 79–97)
Monocytes Absolute: 0.7 10*3/uL (ref 0.1–0.9)
Monocytes: 8 %
Neutrophils Absolute: 5.6 10*3/uL (ref 1.4–7.0)
Neutrophils: 68 %
Platelets: 215 10*3/uL (ref 150–450)
RBC: 4.64 x10E6/uL (ref 4.14–5.80)
RDW: 12.8 % (ref 11.6–15.4)
WBC: 8.4 10*3/uL (ref 3.4–10.8)

## 2020-10-26 LAB — COMPREHENSIVE METABOLIC PANEL
ALT: 16 IU/L (ref 0–44)
AST: 13 IU/L (ref 0–40)
Albumin/Globulin Ratio: 1.4 (ref 1.2–2.2)
Albumin: 4 g/dL (ref 3.8–4.8)
Alkaline Phosphatase: 68 IU/L (ref 44–121)
BUN/Creatinine Ratio: 14 (ref 10–24)
BUN: 18 mg/dL (ref 8–27)
Bilirubin Total: 0.4 mg/dL (ref 0.0–1.2)
CO2: 23 mmol/L (ref 20–29)
Calcium: 9.3 mg/dL (ref 8.6–10.2)
Chloride: 100 mmol/L (ref 96–106)
Creatinine, Ser: 1.25 mg/dL (ref 0.76–1.27)
Globulin, Total: 2.8 g/dL (ref 1.5–4.5)
Glucose: 122 mg/dL — ABNORMAL HIGH (ref 65–99)
Potassium: 4.4 mmol/L (ref 3.5–5.2)
Sodium: 142 mmol/L (ref 134–144)
Total Protein: 6.8 g/dL (ref 6.0–8.5)
eGFR: 63 mL/min/{1.73_m2} (ref >59)

## 2020-10-26 LAB — LIPID PANEL W/O CHOL/HDL RATIO
Cholesterol, Total: 128 mg/dL (ref 100–199)
HDL: 28 mg/dL — ABNORMAL LOW (ref >39)
LDL Chol Calc (NIH): 64 mg/dL (ref 0–99)
Triglycerides: 220 mg/dL — ABNORMAL HIGH (ref 0–149)
VLDL Cholesterol Cal: 36 mg/dL (ref 5–40)

## 2020-10-26 LAB — TSH: TSH: 1.5 u[IU]/mL (ref 0.450–4.500)

## 2020-10-26 LAB — PSA: Prostate Specific Ag, Serum: 0.6 ng/mL (ref 0.0–4.0)

## 2020-10-26 LAB — MAGNESIUM: Magnesium: 2 mg/dL (ref 1.6–2.3)

## 2020-10-26 NOTE — Progress Notes (Signed)
Contacted via [place] morning Jordan Gaines, your labs have returned and overall everything continues to look fantastic.  Continue all current medications.  My only concern is sugar is a little elevated at 122.  I would recommend that when you establish with provider in [place] you alert them to this and have them check diabetes testing on you -- an A1c that looks at sugar levels over past 3 months.  I suspect this may be in prediabetes range.  I would recommend heavy focus on diet and exercise at this time and have new provider check this.  Any questions?  Good luck on your new adventures and enjoy that pool for me:) Keep being awesome!!  Thank you for allowing me to participate in your care.  I appreciate you. Kindest regards, Jordan Gaines

## 2021-01-06 ENCOUNTER — Other Ambulatory Visit: Payer: Self-pay | Admitting: Nurse Practitioner

## 2021-01-06 MED ORDER — TIZANIDINE HCL 2 MG PO TABS: ORAL_TABLET | ORAL | 4 refills | Status: AC

## 2021-02-06 ENCOUNTER — Other Ambulatory Visit: Payer: Self-pay

## 2021-02-06 DIAGNOSIS — R339 Retention of urine, unspecified: Secondary | ICD-10-CM

## 2021-02-06 MED ORDER — TAMSULOSIN HCL 0.4 MG PO CAPS: 0.4000 mg | ORAL_CAPSULE | Freq: Every day | ORAL | 2 refills | Status: AC

## 2021-03-20 ENCOUNTER — Encounter: Payer: Self-pay | Admitting: Nurse Practitioner

## 2021-03-21 ENCOUNTER — Encounter: Payer: Self-pay | Admitting: Urology

## 2021-03-21 ENCOUNTER — Encounter: Payer: Self-pay | Admitting: Nurse Practitioner

## 2021-03-24 ENCOUNTER — Encounter: Payer: Self-pay | Admitting: Nurse Practitioner

## 2021-03-24 NOTE — Telephone Encounter (Signed)
Pt would like a call back to let him know letter is at front desk. His son [name] will pick up

## 2021-04-17 ENCOUNTER — Ambulatory Visit: Payer: BC Managed Care – PPO | Admitting: Urology

## 2021-07-02 ENCOUNTER — Other Ambulatory Visit: Payer: Self-pay | Admitting: Gastroenterology

## 2021-08-12 ENCOUNTER — Telehealth: Payer: Self-pay | Admitting: Gastroenterology

## 2021-08-12 NOTE — Telephone Encounter (Signed)
Patient has [place] out of [place]. Will no longer be a patient at [facility].  ?

## 2021-09-05 ENCOUNTER — Ambulatory Visit: Payer: BC Managed Care – PPO | Admitting: Urology

## 2021-12-15 ENCOUNTER — Other Ambulatory Visit: Payer: Self-pay | Admitting: Nurse Practitioner

## 2021-12-17 NOTE — Telephone Encounter (Signed)
Called pt - Pt now living in [place] . Pt has established care with a new pcp.

## 2021-12-17 NOTE — Telephone Encounter (Signed)
Requested medications are due for refill today.  yes  Requested medications are on the active medications list.  yes  Last refill. 10/25/2020 #90 4 refills  Future visit scheduled.   no  Notes to clinic.  Pt has moved to [place] and has established care with a new PCP.    Requested Prescriptions  Pending Prescriptions Disp Refills   atorvastatin (LIPITOR) 20 MG tablet [Pharmacy Med Name: ATORVASTATIN 20 MG TABLET] 90 tablet 4    Sig: TAKE 1 TABLET BY MOUTH EVERY DAY     Cardiovascular:  Antilipid - Statins Failed - 12/15/2021  1:01 AM      Failed - Valid encounter within last 12 months    Recent Outpatient Visits           1 year ago Class 3 severe obesity due to excess calories without serious comorbidity with body mass index (BMI) of 40.0 to 44.9 in adult [facility])   [place], Jolene T, NP   1 year ago Elevated BP without diagnosis of hypertension   [place], [place] T, NP   2 years ago PE (physical exam), annual   [place], [place] T, NP   2 years ago Primary osteoarthritis of both knees   [place], [place] T, NP   2 years ago Aortic atherosclerosis ([place])   [place], [place] T, NP              Failed - Lipid Panel in normal range within the last 12 months    Cholesterol, Total  Date Value Ref Range Status  10/25/2020 128 100 - 199 mg/dL Final   LDL Chol Calc (NIH)  Date Value Ref Range Status  10/25/2020 64 0 - 99 mg/dL Final   HDL  Date Value Ref Range Status  10/25/2020 28 (L) >39 mg/dL Final   Triglycerides  Date Value Ref Range Status  10/25/2020 220 (H) 0 - 149 mg/dL Final         Passed - Patient is not pregnant

## 2021-12-17 NOTE — Telephone Encounter (Signed)
Patient has not been seen since 10/2020. Needs an appointment for refills.

## 2021-12-28 ENCOUNTER — Other Ambulatory Visit: Payer: Self-pay | Admitting: Gastroenterology
